# Patient Record
Sex: Male | Born: 1955 | Race: White | Hispanic: No | Marital: Married | Smoking: Former smoker
Health system: Southern US, Community
[De-identification: ages and names within clinical notes are randomized; demographics above are authoritative.]

## PROBLEM LIST (undated history)

## (undated) DIAGNOSIS — L729 Follicular cyst of the skin and subcutaneous tissue, unspecified: Secondary | ICD-10-CM

## (undated) DIAGNOSIS — Z789 Other specified health status: Secondary | ICD-10-CM

## (undated) HISTORY — PX: ROTATOR CUFF REPAIR: SHX139

## (undated) HISTORY — PX: OTHER SURGICAL HISTORY: SHX169

## (undated) HISTORY — PX: JOINT REPLACEMENT: SHX530

## (undated) HISTORY — PX: MECKEL DIVERTICULUM EXCISION: SHX314

---

## 2008-12-27 DEATH — deceased

## 2014-11-21 ENCOUNTER — Other Ambulatory Visit: Payer: Self-pay | Admitting: Neurosurgery

## 2014-11-22 ENCOUNTER — Other Ambulatory Visit: Payer: Self-pay | Admitting: Neurosurgery

## 2014-12-07 ENCOUNTER — Ambulatory Visit: Payer: Self-pay | Admitting: Neurology

## 2014-12-14 ENCOUNTER — Encounter (HOSPITAL_COMMUNITY)
Admission: RE | Admit: 2014-12-14 | Discharge: 2014-12-14 | Disposition: A | Discharge status: Home / Self Care | Payer: BLUE CROSS/BLUE SHIELD | Source: Ambulatory Visit | Attending: Neurosurgery | Admitting: Neurosurgery

## 2014-12-14 ENCOUNTER — Encounter (HOSPITAL_COMMUNITY): Payer: Self-pay

## 2014-12-14 DIAGNOSIS — Z01812 Encounter for preprocedural laboratory examination: Secondary | ICD-10-CM | POA: Diagnosis present

## 2014-12-14 DIAGNOSIS — M5126 Other intervertebral disc displacement, lumbar region: Secondary | ICD-10-CM | POA: Insufficient documentation

## 2014-12-14 HISTORY — DX: Other specified health status: Z78.9

## 2014-12-14 HISTORY — DX: Follicular cyst of the skin and subcutaneous tissue, unspecified: L72.9

## 2014-12-14 LAB — SURGICAL PCR SCREEN
MRSA, PCR: NEGATIVE
Staphylococcus aureus: NEGATIVE

## 2014-12-14 LAB — BASIC METABOLIC PANEL
Anion gap: 10 (ref 5–15)
BUN: 5 mg/dL — ABNORMAL LOW (ref 6–20)
CO2: 26 mmol/L (ref 22–32)
Calcium: 10 mg/dL (ref 8.9–10.3)
Chloride: 100 mmol/L — ABNORMAL LOW (ref 101–111)
Creatinine, Ser: 0.65 mg/dL (ref 0.61–1.24)
GFR calc Af Amer: >60 mL/min (ref >60)
GFR calc non Af Amer: >60 mL/min (ref >60)
Glucose, Bld: 93 mg/dL (ref 65–99)
Potassium: 3.8 mmol/L (ref 3.5–5.1)
Sodium: 136 mmol/L (ref 135–145)

## 2014-12-14 LAB — CBC
HCT: 46.3 % (ref 39.0–52.0)
Hemoglobin: 16.2 g/dL (ref 13.0–17.0)
MCH: 34.2 pg — ABNORMAL HIGH (ref 26.0–34.0)
MCHC: 35 g/dL (ref 30.0–36.0)
MCV: 97.7 fL (ref 78.0–100.0)
Platelets: 253 10*3/uL (ref 150–400)
RBC: 4.74 MIL/uL (ref 4.22–5.81)
RDW: 12.7 % (ref 11.5–15.5)
WBC: 11 10*3/uL — ABNORMAL HIGH (ref 4.0–10.5)

## 2014-12-14 NOTE — Pre-Procedure Instructions (Signed)
    Douglas Henry  12/14/2014      [id number] - [place],  - [address] [facility] [address] AT [facility] [address] [place] [address] Phone: [phone] Fax: [phone]  [facility] [phone] - [place], [place] - [address] [place] [address] Phone: [phone] Fax: [phone]    Your procedure is scheduled on 12/24/14.  Report to [facility] Admitting at 530 A.M.  Call this number if you have problems the morning of surgery:  [phone]   Remember:  Do not eat food or drink liquids after midnight.  Take these medicines the morning of surgery with A SIP OF WATER none   Do not wear jewelry, make-up or nail polish.  Do not wear lotions, powders, or perfumes.  You may wear deodorant.  Do not shave 48 hours prior to surgery.  Men may shave face and neck.  Do not bring valuables to the hospital.  [facility] is not responsible for any belongings or valuables.  Contacts, dentures or bridgework may not be worn into surgery.  Leave your suitcase in the car.  After surgery it may be brought to your room.  For patients admitted to the hospital, discharge time will be determined by your treatment team.  Patients discharged the day of surgery will not be allowed to drive home.   Name and phone number of your driver:    Special instructions:    Please read over the following fact sheets that you were given. Pain Booklet, Coughing and Deep Breathing and MRSA Information

## 2014-12-24 ENCOUNTER — Ambulatory Visit (HOSPITAL_COMMUNITY)
Admission: RE | Admit: 2014-12-24 | Discharge: 2014-12-24 | Disposition: A | Discharge status: Home / Self Care | Payer: BLUE CROSS/BLUE SHIELD | Source: Ambulatory Visit | Attending: Neurosurgery | Admitting: Neurosurgery

## 2014-12-24 ENCOUNTER — Ambulatory Visit (HOSPITAL_COMMUNITY): Payer: BLUE CROSS/BLUE SHIELD | Admitting: Anesthesiology

## 2014-12-24 ENCOUNTER — Encounter (HOSPITAL_COMMUNITY)
Admission: RE | Disposition: A | Discharge status: Home / Self Care | Payer: Self-pay | Source: Ambulatory Visit | Attending: Neurosurgery

## 2014-12-24 ENCOUNTER — Ambulatory Visit (HOSPITAL_COMMUNITY): Payer: BLUE CROSS/BLUE SHIELD

## 2014-12-24 DIAGNOSIS — Z885 Allergy status to narcotic agent status: Secondary | ICD-10-CM | POA: Diagnosis not present

## 2014-12-24 DIAGNOSIS — M4806 Spinal stenosis, lumbar region: Secondary | ICD-10-CM | POA: Insufficient documentation

## 2014-12-24 DIAGNOSIS — M5116 Intervertebral disc disorders with radiculopathy, lumbar region: Secondary | ICD-10-CM | POA: Insufficient documentation

## 2014-12-24 DIAGNOSIS — Z888 Allergy status to other drugs, medicaments and biological substances status: Secondary | ICD-10-CM | POA: Diagnosis not present

## 2014-12-24 DIAGNOSIS — F17210 Nicotine dependence, cigarettes, uncomplicated: Secondary | ICD-10-CM | POA: Diagnosis not present

## 2014-12-24 DIAGNOSIS — M5126 Other intervertebral disc displacement, lumbar region: Secondary | ICD-10-CM | POA: Diagnosis present

## 2014-12-24 HISTORY — PX: LUMBAR LAMINECTOMY/DECOMPRESSION MICRODISCECTOMY: SHX5026

## 2014-12-24 SURGERY — LUMBAR LAMINECTOMY/DECOMPRESSION MICRODISCECTOMY 1 LEVEL
Anesthesia: General | Site: Spine Lumbar | Laterality: Left

## 2014-12-24 MED ORDER — ZOLPIDEM TARTRATE 5 MG PO TABS: 5.0000 mg | ORAL_TABLET | Freq: Every evening | ORAL | Status: DC | PRN

## 2014-12-24 MED ORDER — THROMBIN 5000 UNITS EX SOLR
CUTANEOUS | Status: DC | PRN
  Administered 2014-12-24 (×2): 5000 [IU] via TOPICAL

## 2014-12-24 MED ORDER — HYDROCODONE-ACETAMINOPHEN 5-325 MG PO TABS
1.0000 | ORAL_TABLET | ORAL | Status: DC | PRN
  Administered 2014-12-24: 2 via ORAL
  Filled 2014-12-24: qty 2

## 2014-12-24 MED ORDER — PROPOFOL 10 MG/ML IV BOLUS
INTRAVENOUS | Status: DC | PRN
  Administered 2014-12-24: 200 mg via INTRAVENOUS

## 2014-12-24 MED ORDER — HYDROXYZINE HCL 25 MG PO TABS: 50.0000 mg | ORAL_TABLET | ORAL | Status: DC | PRN

## 2014-12-24 MED ORDER — ACETAMINOPHEN 160 MG/5ML PO SOLN
325.0000 mg | ORAL | Status: DC | PRN
  Filled 2014-12-24: qty 20.3

## 2014-12-24 MED ORDER — ACETAMINOPHEN 325 MG PO TABS: 325.0000 mg | ORAL_TABLET | ORAL | Status: DC | PRN

## 2014-12-24 MED ORDER — BISACODYL 10 MG RE SUPP: 10.0000 mg | Freq: Every day | RECTAL | Status: DC | PRN

## 2014-12-24 MED ORDER — HYDROMORPHONE HCL 1 MG/ML IJ SOLN
INTRAMUSCULAR | Status: AC
  Filled 2014-12-24: qty 1

## 2014-12-24 MED ORDER — NEOSTIGMINE METHYLSULFATE 10 MG/10ML IV SOLN
INTRAVENOUS | Status: DC | PRN
  Administered 2014-12-24: 3 mg via INTRAVENOUS

## 2014-12-24 MED ORDER — ROCURONIUM BROMIDE 100 MG/10ML IV SOLN
INTRAVENOUS | Status: DC | PRN
  Administered 2014-12-24: 50 mg via INTRAVENOUS

## 2014-12-24 MED ORDER — HYDROMORPHONE HCL 1 MG/ML IJ SOLN
0.2500 mg | INTRAMUSCULAR | Status: DC | PRN
  Administered 2014-12-24: 0.5 mg via INTRAVENOUS

## 2014-12-24 MED ORDER — GLYCOPYRROLATE 0.2 MG/ML IJ SOLN
INTRAMUSCULAR | Status: DC | PRN
  Administered 2014-12-24: 0.4 mg via INTRAVENOUS

## 2014-12-24 MED ORDER — ONDANSETRON HCL 40 MG/20ML IJ SOLN: 8.0000 mg | Freq: Four times a day (QID) | INTRAMUSCULAR | Status: DC | PRN

## 2014-12-24 MED ORDER — SODIUM CHLORIDE 0.9 % IJ SOLN
INTRAMUSCULAR | Status: AC
  Filled 2014-12-24: qty 10

## 2014-12-24 MED ORDER — DEXAMETHASONE SODIUM PHOSPHATE 4 MG/ML IJ SOLN
INTRAMUSCULAR | Status: AC
  Filled 2014-12-24: qty 1

## 2014-12-24 MED ORDER — FENTANYL CITRATE (PF) 250 MCG/5ML IJ SOLN
INTRAMUSCULAR | Status: AC
  Filled 2014-12-24: qty 5

## 2014-12-24 MED ORDER — LIDOCAINE-EPINEPHRINE 1 %-1:100000 IJ SOLN
INTRAMUSCULAR | Status: DC | PRN
  Administered 2014-12-24: 10 mL

## 2014-12-24 MED ORDER — ALUM & MAG HYDROXIDE-SIMETH 200-200-20 MG/5ML PO SUSP: 30.0000 mL | Freq: Four times a day (QID) | ORAL | Status: DC | PRN

## 2014-12-24 MED ORDER — OXYCODONE HCL 5 MG/5ML PO SOLN: 5.0000 mg | Freq: Once | ORAL | Status: DC | PRN

## 2014-12-24 MED ORDER — DEXAMETHASONE SODIUM PHOSPHATE 4 MG/ML IJ SOLN
INTRAMUSCULAR | Status: DC | PRN
  Administered 2014-12-24: 4 mg via INTRAVENOUS

## 2014-12-24 MED ORDER — MIDAZOLAM HCL 5 MG/5ML IJ SOLN
INTRAMUSCULAR | Status: DC | PRN
  Administered 2014-12-24: 2 mg via INTRAVENOUS

## 2014-12-24 MED ORDER — MAGNESIUM HYDROXIDE 400 MG/5ML PO SUSP: 30.0000 mL | Freq: Every day | ORAL | Status: DC | PRN

## 2014-12-24 MED ORDER — HYDROXYZINE HCL 50 MG/ML IM SOLN
50.0000 mg | INTRAMUSCULAR | Status: DC | PRN
  Filled 2014-12-24: qty 1

## 2014-12-24 MED ORDER — SODIUM CHLORIDE 0.9 % IR SOLN
Status: DC | PRN
  Administered 2014-12-24: 08:00:00

## 2014-12-24 MED ORDER — HYDROCODONE-ACETAMINOPHEN 5-325 MG PO TABS: 1.0000 | ORAL_TABLET | ORAL | Status: DC | PRN

## 2014-12-24 MED ORDER — OXYCODONE HCL 5 MG PO TABS: 5.0000 mg | ORAL_TABLET | Freq: Once | ORAL | Status: DC | PRN

## 2014-12-24 MED ORDER — FENTANYL CITRATE (PF) 100 MCG/2ML IJ SOLN
INTRAMUSCULAR | Status: DC | PRN
  Administered 2014-12-24: 100 ug via INTRAVENOUS

## 2014-12-24 MED ORDER — ACETAMINOPHEN 325 MG PO TABS
650.0000 mg | ORAL_TABLET | ORAL | Status: DC | PRN
Start: 2014-12-24 — End: 2014-12-24

## 2014-12-24 MED ORDER — ACETAMINOPHEN 10 MG/ML IV SOLN
INTRAVENOUS | Status: AC
  Administered 2014-12-24: 1000 mg via INTRAVENOUS
  Filled 2014-12-24: qty 100

## 2014-12-24 MED ORDER — MORPHINE SULFATE 4 MG/ML IJ SOLN: 4.0000 mg | INTRAMUSCULAR | Status: DC | PRN

## 2014-12-24 MED ORDER — SODIUM CHLORIDE 0.9 % IJ SOLN: 3.0000 mL | Freq: Two times a day (BID) | INTRAMUSCULAR | Status: DC

## 2014-12-24 MED ORDER — MENTHOL 3 MG MT LOZG: 1.0000 | LOZENGE | OROMUCOSAL | Status: DC | PRN

## 2014-12-24 MED ORDER — PHENOL 1.4 % MT LIQD: 1.0000 | OROMUCOSAL | Status: DC | PRN

## 2014-12-24 MED ORDER — ONDANSETRON HCL 4 MG/2ML IJ SOLN: 4.0000 mg | Freq: Four times a day (QID) | INTRAMUSCULAR | Status: DC | PRN

## 2014-12-24 MED ORDER — SUCCINYLCHOLINE CHLORIDE 20 MG/ML IJ SOLN
INTRAMUSCULAR | Status: AC
  Filled 2014-12-24: qty 1

## 2014-12-24 MED ORDER — SODIUM CHLORIDE 0.9 % IV SOLN: 250.0000 mL | INTRAVENOUS | Status: DC

## 2014-12-24 MED ORDER — EPHEDRINE SULFATE 50 MG/ML IJ SOLN
INTRAMUSCULAR | Status: AC
  Filled 2014-12-24: qty 1

## 2014-12-24 MED ORDER — MIDAZOLAM HCL 2 MG/2ML IJ SOLN
INTRAMUSCULAR | Status: AC
  Filled 2014-12-24: qty 2

## 2014-12-24 MED ORDER — CEFAZOLIN SODIUM-DEXTROSE 2-3 GM-% IV SOLR
2.0000 g | INTRAVENOUS | Status: AC
  Administered 2014-12-24: 2 g via INTRAVENOUS

## 2014-12-24 MED ORDER — FENTANYL CITRATE (PF) 100 MCG/2ML IJ SOLN
INTRAMUSCULAR | Status: DC | PRN
  Administered 2014-12-24: 50 ug via INTRAVENOUS
  Administered 2014-12-24: 100 ug via INTRAVENOUS
  Administered 2014-12-24 (×2): 50 ug via INTRAVENOUS

## 2014-12-24 MED ORDER — PROPOFOL 10 MG/ML IV BOLUS
INTRAVENOUS | Status: AC
  Filled 2014-12-24: qty 20

## 2014-12-24 MED ORDER — THROMBIN 5000 UNITS EX SOLR
OROMUCOSAL | Status: DC | PRN
  Administered 2014-12-24: 09:00:00 via TOPICAL

## 2014-12-24 MED ORDER — OXYCODONE-ACETAMINOPHEN 5-325 MG PO TABS: 1.0000 | ORAL_TABLET | ORAL | Status: DC | PRN

## 2014-12-24 MED ORDER — SODIUM CHLORIDE 0.9 % IV SOLN
10.0000 mg | INTRAVENOUS | Status: DC | PRN
  Administered 2014-12-24: 10 ug/min via INTRAVENOUS

## 2014-12-24 MED ORDER — CEFAZOLIN SODIUM-DEXTROSE 2-3 GM-% IV SOLR: 2.0000 g | INTRAVENOUS | Status: DC

## 2014-12-24 MED ORDER — ONDANSETRON HCL 4 MG/2ML IJ SOLN
INTRAMUSCULAR | Status: AC
  Filled 2014-12-24: qty 2

## 2014-12-24 MED ORDER — ONDANSETRON HCL 4 MG/2ML IJ SOLN
INTRAMUSCULAR | Status: DC | PRN
  Administered 2014-12-24 (×2): 4 mg via INTRAVENOUS

## 2014-12-24 MED ORDER — KCL IN DEXTROSE-NACL 20-5-0.45 MEQ/L-%-% IV SOLN
INTRAVENOUS | Status: DC
  Filled 2014-12-24 (×3): qty 1000

## 2014-12-24 MED ORDER — FENTANYL CITRATE (PF) 100 MCG/2ML IJ SOLN
INTRAMUSCULAR | Status: AC
  Filled 2014-12-24: qty 2

## 2014-12-24 MED ORDER — EPHEDRINE SULFATE 50 MG/ML IJ SOLN
INTRAMUSCULAR | Status: DC | PRN
  Administered 2014-12-24: 10 mg via INTRAVENOUS

## 2014-12-24 MED ORDER — METHYLPREDNISOLONE ACETATE 80 MG/ML IJ SUSP
INTRAMUSCULAR | Status: DC | PRN
  Administered 2014-12-24: 80 mg

## 2014-12-24 MED ORDER — ACETAMINOPHEN 650 MG RE SUPP: 650.0000 mg | RECTAL | Status: DC | PRN

## 2014-12-24 MED ORDER — CYCLOBENZAPRINE HCL 10 MG PO TABS
10.0000 mg | ORAL_TABLET | Freq: Three times a day (TID) | ORAL | Status: DC | PRN
  Filled 2014-12-24: qty 1

## 2014-12-24 MED ORDER — LIDOCAINE HCL (CARDIAC) 20 MG/ML IV SOLN
INTRAVENOUS | Status: DC | PRN
  Administered 2014-12-24: 100 mg via INTRAVENOUS

## 2014-12-24 MED ORDER — ARTIFICIAL TEARS OP OINT
TOPICAL_OINTMENT | OPHTHALMIC | Status: AC
  Filled 2014-12-24: qty 3.5

## 2014-12-24 MED ORDER — ONDANSETRON HCL 4 MG PO TABS: 4.0000 mg | ORAL_TABLET | Freq: Four times a day (QID) | ORAL | Status: DC | PRN

## 2014-12-24 MED ORDER — ROCURONIUM BROMIDE 50 MG/5ML IV SOLN
INTRAVENOUS | Status: AC
  Filled 2014-12-24: qty 1

## 2014-12-24 MED ORDER — KETOROLAC TROMETHAMINE 30 MG/ML IJ SOLN
30.0000 mg | Freq: Four times a day (QID) | INTRAMUSCULAR | Status: DC
  Administered 2014-12-24 (×2): 30 mg via INTRAVENOUS
  Filled 2014-12-24 (×2): qty 1

## 2014-12-24 MED ORDER — 0.9 % SODIUM CHLORIDE (POUR BTL) OPTIME
TOPICAL | Status: DC | PRN
  Administered 2014-12-24: 1000 mL

## 2014-12-24 MED ORDER — LACTATED RINGERS IV SOLN
INTRAVENOUS | Status: DC | PRN
  Administered 2014-12-24: 07:00:00 via INTRAVENOUS

## 2014-12-24 MED ORDER — LIDOCAINE HCL (CARDIAC) 20 MG/ML IV SOLN
INTRAVENOUS | Status: AC
  Filled 2014-12-24: qty 5

## 2014-12-24 MED ORDER — BUPIVACAINE HCL (PF) 0.5 % IJ SOLN
INTRAMUSCULAR | Status: DC | PRN
  Administered 2014-12-24: 10 mL

## 2014-12-24 MED ORDER — KETOROLAC TROMETHAMINE 30 MG/ML IJ SOLN
INTRAMUSCULAR | Status: AC
  Filled 2014-12-24: qty 1

## 2014-12-24 MED ORDER — SODIUM CHLORIDE 0.9 % IJ SOLN: 3.0000 mL | INTRAMUSCULAR | Status: DC | PRN

## 2014-12-24 MED ORDER — KETOROLAC TROMETHAMINE 30 MG/ML IJ SOLN
30.0000 mg | Freq: Once | INTRAMUSCULAR | Status: AC
  Administered 2014-12-24: 30 mg via INTRAVENOUS

## 2014-12-24 SURGICAL SUPPLY — 64 items
ADH SKN CLS APL DERMABOND .7 (GAUZE/BANDAGES/DRESSINGS) ×2
APL SKNCLS STERI-STRIP NONHPOA (GAUZE/BANDAGES/DRESSINGS)
BAG DECANTER FOR FLEXI CONT (MISCELLANEOUS) ×2 IMPLANT
BENZOIN TINCTURE PRP APPL 2/3 (GAUZE/BANDAGES/DRESSINGS) IMPLANT
BLADE CLIPPER SURG (BLADE) IMPLANT
BRUSH SCRUB EZ PLAIN DRY (MISCELLANEOUS) ×2 IMPLANT
BUR ACORN 6.0 ACORN (BURR) IMPLANT
BUR ACRON 5.0MM COATED (BURR) ×1 IMPLANT
BUR MATCHSTICK NEURO 3.0 LAGG (BURR) ×2 IMPLANT
CANISTER SUCT 3000ML PPV (MISCELLANEOUS) ×2 IMPLANT
CONT SPEC 4OZ CLIKSEAL STRL BL (MISCELLANEOUS) IMPLANT
DERMABOND ADVANCED (GAUZE/BANDAGES/DRESSINGS) ×2
DERMABOND ADVANCED .7 DNX12 (GAUZE/BANDAGES/DRESSINGS) IMPLANT
DRAPE LAPAROTOMY 100X72X124 (DRAPES) ×2 IMPLANT
DRAPE MICROSCOPE LEICA (MISCELLANEOUS) ×3 IMPLANT
DRAPE POUCH INSTRU U-SHP 10X18 (DRAPES) ×2 IMPLANT
DRSG EMULSION OIL 3X3 NADH (GAUZE/BANDAGES/DRESSINGS) IMPLANT
ELECT REM PT RETURN 9FT ADLT (ELECTROSURGICAL) ×2
ELECTRODE REM PT RTRN 9FT ADLT (ELECTROSURGICAL) ×1 IMPLANT
GAUZE SPONGE 4X4 12PLY STRL (GAUZE/BANDAGES/DRESSINGS) IMPLANT
GAUZE SPONGE 4X4 16PLY XRAY LF (GAUZE/BANDAGES/DRESSINGS) IMPLANT
GLOVE BIO SURGEON STRL SZ7 (GLOVE) ×2 IMPLANT
GLOVE BIOGEL PI IND STRL 8 (GLOVE) ×1 IMPLANT
GLOVE BIOGEL PI IND STRL 8.5 (GLOVE) IMPLANT
GLOVE BIOGEL PI INDICATOR 8 (GLOVE) ×2
GLOVE BIOGEL PI INDICATOR 8.5 (GLOVE) ×1
GLOVE ECLIPSE 7.5 STRL STRAW (GLOVE) ×3 IMPLANT
GLOVE ECLIPSE 8.0 STRL XLNG CF (GLOVE) ×1 IMPLANT
GLOVE EXAM NITRILE LRG STRL (GLOVE) IMPLANT
GLOVE EXAM NITRILE MD LF STRL (GLOVE) IMPLANT
GLOVE EXAM NITRILE XL STR (GLOVE) IMPLANT
GLOVE EXAM NITRILE XS STR PU (GLOVE) IMPLANT
GOWN STRL REUS W/ TWL LRG LVL3 (GOWN DISPOSABLE) ×1 IMPLANT
GOWN STRL REUS W/ TWL XL LVL3 (GOWN DISPOSABLE) IMPLANT
GOWN STRL REUS W/TWL 2XL LVL3 (GOWN DISPOSABLE) ×1 IMPLANT
GOWN STRL REUS W/TWL LRG LVL3 (GOWN DISPOSABLE) ×2
GOWN STRL REUS W/TWL XL LVL3 (GOWN DISPOSABLE) ×2
HEMOSTAT POWDER KIT SURGIFOAM (HEMOSTASIS) ×1 IMPLANT
KIT BASIN OR (CUSTOM PROCEDURE TRAY) ×2 IMPLANT
KIT ROOM TURNOVER OR (KITS) ×2 IMPLANT
NDL HYPO 18GX1.5 BLUNT FILL (NEEDLE) IMPLANT
NDL SPNL 18GX3.5 QUINCKE PK (NEEDLE) ×1 IMPLANT
NDL SPNL 22GX3.5 QUINCKE BK (NEEDLE) ×1 IMPLANT
NEEDLE HYPO 18GX1.5 BLUNT FILL (NEEDLE) ×2 IMPLANT
NEEDLE SPNL 18GX3.5 QUINCKE PK (NEEDLE) ×2 IMPLANT
NEEDLE SPNL 22GX3.5 QUINCKE BK (NEEDLE) ×2 IMPLANT
NS IRRIG 1000ML POUR BTL (IV SOLUTION) ×2 IMPLANT
PACK LAMINECTOMY NEURO (CUSTOM PROCEDURE TRAY) ×2 IMPLANT
PAD ARMBOARD 7.5X6 YLW CONV (MISCELLANEOUS) ×6 IMPLANT
PATTIES SURGICAL .5 X1 (DISPOSABLE) ×1 IMPLANT
RUBBERBAND STERILE (MISCELLANEOUS) ×6 IMPLANT
SPONGE LAP 4X18 X RAY DECT (DISPOSABLE) IMPLANT
SPONGE SURGIFOAM ABS GEL SZ50 (HEMOSTASIS) ×2 IMPLANT
STRIP CLOSURE SKIN 1/2X4 (GAUZE/BANDAGES/DRESSINGS) IMPLANT
SUT PROLENE 6 0 BV (SUTURE) IMPLANT
SUT VIC AB 1 CT1 18XBRD ANBCTR (SUTURE) ×1 IMPLANT
SUT VIC AB 1 CT1 8-18 (SUTURE) ×4
SUT VIC AB 2-0 CP2 18 (SUTURE) ×3 IMPLANT
SUT VIC AB 3-0 SH 8-18 (SUTURE) ×1 IMPLANT
SYR 20ML ECCENTRIC (SYRINGE) ×2 IMPLANT
SYR 5ML LL (SYRINGE) ×1 IMPLANT
TOWEL OR 17X24 6PK STRL BLUE (TOWEL DISPOSABLE) ×2 IMPLANT
TOWEL OR 17X26 10 PK STRL BLUE (TOWEL DISPOSABLE) ×2 IMPLANT
WATER STERILE IRR 1000ML POUR (IV SOLUTION) ×2 IMPLANT

## 2014-12-24 NOTE — Op Note (Signed)
12/24/2014  10:15 AM  PATIENT:  Douglas Henry  59 y.o. male  PRE-OPERATIVE DIAGNOSIS:  Left L4-5 lateral recess, foraminal, and extraforaminal lumbar herniated disc; left L4-5 lateral recess and neural foraminal stenosis, lumbar spondylosis, lumbar degenerative disease, left lumbar radiculopathy  POST-OPERATIVE DIAGNOSIS: Left L4-5 lateral recess, foraminal, and extraforaminal lumbar herniated disc; left L4-5 lateral recess and neural foraminal stenosis, lumbar spondylosis, lumbar degenerative disease, left lumbar radiculopathy  PROCEDURE:  Procedure(s):  Left L4-5 lumbar laminotomy, left L4-5 extraforaminal exploration, left L4-5 microdiscectomy, with microdissection, microsurgical technique, and the operating microscope  SURGEON:  Surgeon(s): Shirlean Kelly, MD Barnett Abu, MD  ASSISTANTS: Barnett Abu, M.D.  ANESTHESIA:   general  EBL:  Total I/O In: 900 [I.V.:900] Out: 100 [Blood:100]  BLOOD ADMINISTERED:none  COUNT: Correct per nursing staff  DICTATION: Patient was brought to the operating room and placed under general endotracheal anesthesia. Patient was turned to prone position the lumbar region was prepped with Betadine soap and solution and draped in a sterile fashion. The midline was infiltrated with local anesthetic with epinephrine.  Midline incision was made and was carried down through the subcutaneous tissue to the lumbar fascia. The lumbar fascia was incised on the left side and the paraspinal muscles were dissected from the spinous processes and lamina in a subperiosteal fashion. An x-ray was taken and the L4-5 intralaminar space was identified. The operating microscope was draped and brought into the field provided additional magnification, illumination, and visualization. The left L4-5 extraforaminal space was exposed. There was significant hypertrophic overgrowth of the left L4-5 facet complex. The extraforaminal space was explored, and the left L4 nerve root was  identified. We further identified the left L4-5 extra foraminal disc annulus, which was protruding. Laminotomy was performed on the left side at L4-5 using the high-speed drill and Kerrison punches. The ligamentum flavum was carefully resected. There was a small-moderate-sized synovial cyst arising from the arthropathic facet joint, that was carefully removed.The underlying thecal sac and exiting L4 and L5 nerve roots were identified. The disc herniation was identified and the thecal sac and nerve root gently retracted medially. Discectomy was begun by incising the annulus in the left L4-5 lateral recess. The disc space was entered, and a thorough discectomy performed using a variety of pituitary rongeurs and micro-curettes. As a discectomy was performed, with particular attention to the lateral portion of the disc, we able to decompress the subligamentous disc herniation laterally within the neural foramen and extraforaminal space. We able to decompress the left L4-5 lateral recess, left L4-5 neural foramen, and left L4-5 extraforaminal space, thereby decompressing the thecal sac, and the exiting L4 and L5 nerve roots. Once the discectomy was completed and good decompression of the thecal sac and L4 and L5 nerve roots had been achieved hemostasis was established with the use of bone wax, bipolar cautery, Gelfoam with thrombin, and Surgifoam. Hemostasis was confirmed. We then instilled 2 cc of fentanyl and 80 mg of Depo-Medrol into the extraforaminal and epidural spaces. Deep fascia was closed with interrupted undyed 1 Vicryl sutures. Scarpa's fascia was closed with interrupted undyed 1 Vicryl sutures in the subcutaneous and subcuticular layer were closed with interrupted inverted 2-0 undyed Vicryl sutures. The skin edges were approximated with Dermabond. Following surgery the patient was turned back to a supine position to be reversed from the anesthetic extubated and transferred to the recovery room for further  care.   PLAN OF CARE: Admit for overnight observation  PATIENT DISPOSITION:  PACU - hemodynamically stable.  Delay start of Pharmacological VTE agent (>24hrs) due to surgical blood loss or risk of bleeding:  yes

## 2014-12-24 NOTE — Anesthesia Preprocedure Evaluation (Addendum)
Anesthesia Evaluation  Patient identified by MRN, date of birth, ID band Patient awake    Reviewed: Allergy & Precautions, NPO status , Patient's Chart, lab work & pertinent test results  History of Anesthesia Complications Negative for: history of anesthetic complications  Airway Mallampati: II  TM Distance: >3 FB Neck ROM: Full    Dental  (+) Teeth Intact   Pulmonary neg shortness of breath, neg sleep apnea, neg COPDneg recent URI, Current Smoker,  breath sounds clear to auscultation        Cardiovascular negative cardio ROS  Rhythm:Regular     Neuro/Psych Low back pain  Neuromuscular disease negative psych ROS   GI/Hepatic negative GI ROS, Neg liver ROS,   Endo/Other  negative endocrine ROS  Renal/GU negative Renal ROS     Musculoskeletal negative musculoskeletal ROS (+)   Abdominal   Peds  Hematology negative hematology ROS (+)   Anesthesia Other Findings   Reproductive/Obstetrics                           Anesthesia Physical Anesthesia Plan  ASA: II  Anesthesia Plan: General   Post-op Pain Management:    Induction: Intravenous  Airway Management Planned: Oral ETT  Additional Equipment: None  Intra-op Plan:   Post-operative Plan: Extubation in OR  Informed Consent: I have reviewed the patients History and Physical, chart, labs and discussed the procedure including the risks, benefits and alternatives for the proposed anesthesia with the patient or authorized representative who has indicated his/her understanding and acceptance.   Dental advisory given  Plan Discussed with: CRNA and Surgeon  Anesthesia Plan Comments:         Anesthesia Quick Evaluation

## 2014-12-24 NOTE — H&P (Signed)
Subjective: Patient is a 59 y.o. right-handed white male who is admitted for treatment of left lumbar radiculopathy secondary to an L4-5 lumbar disc herniation superimposed on underlying lumbar spondylosis and degenerative disc disease, with resulting left L4-5 lateral recess stenosis as well as neural compression within the left L4-5 foramen and extra foraminal space.  Patient has significant weakness and numbness in the distal left lower extremity on examination and is admitted now for left L4-5 lumbar laminotomy, extra foraminal exploration, and microdiscectomy.    Past Medical History  Diagnosis Date  . Skin cysts, generalized   . Medical history non-contributory     Past Surgical History  Procedure Laterality Date  . Joint replacement      scope  rt knee  . Rotator cuff repair    . Meckel diverticulum excision    . Sagill   spilt      No prescriptions prior to admission   Allergies  Allergen Reactions  . Darvon [Propoxyphene] Nausea Only  . Lexapro [Escitalopram Oxalate] Nausea Only    History  Substance Use Topics  . Smoking status: Current Every Day Smoker -- 1.00 packs/day for 38 years  . Smokeless tobacco: Not on file  . Alcohol Use: Yes     Comment: daily    No family history on file.   Review of Systems A comprehensive review of systems was negative.  Objective: Vital signs in last 24 hours: Pulse Rate:  [88] 88 (06/27 0554) Resp:  [20] 20 (06/27 0554) BP: (117)/(84) 117/84 mmHg (06/27 0554) SpO2:  [99 %] 99 % (06/27 0554)  EXAM: Patient is a well-developed well-nourished white male in no acute distress. Lungs are clear to auscultation , the patient has symmetrical respiratory excursion. Heart has a regular rate and rhythm normal S1 and S2 no murmur.   Abdomen is soft nontender nondistended bowel sounds are present. Extremity examination shows no clubbing cyanosis or edema. Neurologic examination shows 5/5 strength through the right lower extremity including the  iliopsoas, quadriceps, dorsiflexor, EHL, and plantar flexor. However in the left lower extremity iliopsoas is 4, the quadriceps is 5, the dorsiflexor is 4-4+, the EHL is 3, the everter is 3, the inverter is 4-4+, and the plantar flexors 5. Sensation is decreased to pinprick in the medial aspect of the left leg as well as to the dorsum the left foot, more so medially than laterally. Reflexes show the left quadriceps is 1 right quadriceps is 2. The gastric name is absent bilaterally. Toes are down going bilaterally. Gait and stance both favor the left lower extremity.  Data Review:CBC    Component Value Date/Time   WBC 11.0* 12/14/2014 1033   RBC 4.74 12/14/2014 1033   HGB 16.2 12/14/2014 1033   HCT 46.3 12/14/2014 1033   PLT 253 12/14/2014 1033   MCV 97.7 12/14/2014 1033   MCH 34.2* 12/14/2014 1033   MCHC 35.0 12/14/2014 1033   RDW 12.7 12/14/2014 1033                          BMET    Component Value Date/Time   NA 136 12/14/2014 1033   K 3.8 12/14/2014 1033   CL 100* 12/14/2014 1033   CO2 26 12/14/2014 1033   GLUCOSE 93 12/14/2014 1033   BUN 5* 12/14/2014 1033   CREATININE 0.65 12/14/2014 1033   CALCIUM 10.0 12/14/2014 1033   GFRNONAA >60 12/14/2014 1033   GFRAA >60 12/14/2014 1033  Assessment/Plan: Patient with left lumbar radiculopathy, with weakness and numbness, secondary to left L4-5 lumbar disc patient left L4-5 lateral recess stenosis, neural compression within the left L4-5 foramen and extraforaminal space.. Patient is admitted for a left L4-5 lumbar laminotomy, extra foraminal exploration, and microdiscectomy.  I've discussed with the patient the nature of his condition, the nature the surgical procedure, the typical length of surgery, hospital stay, and overall recuperation. We discussed limitations postoperatively. I discussed risks of surgery including risks of infection, bleeding, possibly need for transfusion, the risk of nerve root dysfunction with pain, weakness,  numbness, or paresthesias, or risk of dural tear and CSF leakage and possible need for further surgery, the risk of recurrent disc herniation and the possible need for further surgery, and the risk of anesthetic complications including myocardial infarction, stroke, pneumonia, and death. Understanding all this the patient does wish to proceed with surgery and is admitted for such.   Hewitt Henry,Douglas W, MD 12/24/2014 7:21 AM

## 2014-12-24 NOTE — Transfer of Care (Signed)
Immediate Anesthesia Transfer of Care Note  Patient: Douglas Henry  Procedure(s) Performed: Procedure(s) with comments: LEFT LUMBAR FOUR-FIVE LUMBAR LAMINECTOMY/DECOMPRESSION MICRODISCECTOMY  (Left) - Left L45 laminotomy, extraforaminal exploration and microdiskectomy  Patient Location: PACU  Anesthesia Type:General  Level of Consciousness: awake, alert  and oriented  Airway & Oxygen Therapy: Patient Spontanous Breathing and Patient connected to nasal cannula oxygen  Post-op Assessment: Report given to RN, Post -op Vital signs reviewed and stable and Patient moving all extremities X 4  Post vital signs: Reviewed and stable  Last Vitals:  Filed Vitals:   12/24/14 0554  BP: 117/84  Pulse: 88  Resp: 20    Complications: No apparent anesthesia complications

## 2014-12-24 NOTE — Progress Notes (Signed)
Discharge instructions, education and Rx given to patient with wife at bedside and they both verbalized understanding. No drainage, no redness and no swelling noted on incision site. Pain is minimal pert patient.

## 2014-12-24 NOTE — Discharge Summary (Signed)
Physician Discharge Summary  Patient ID: Douglas LukesRichard H Sharpless MRN: [id number] DOB/AGE: 59/01/1956 59 y.o.  Admit date: 12/24/2014 Discharge date: 12/24/2014  Admission Diagnoses:  Left L4-5 lateral recess, foraminal, and extraforaminal lumbar herniated disc; left L4-5 lateral recess and neural foraminal stenosis, lumbar spondylosis, lumbar degenerative disease, left lumbar radiculopathy  Discharge Diagnoses:  Left L4-5 lateral recess, foraminal, and extraforaminal lumbar herniated disc; left L4-5 lateral recess and neural foraminal stenosis, lumbar spondylosis, lumbar degenerative disease, left lumbar radiculopathy Active Problems:   HNP (herniated nucleus pulposus), lumbar   Discharged Condition: good  Hospital Course: Patient was admitted, underwent an left L4-5 lumbar laminotomy, left L4-5 extra foraminal exploration, and left L4-5 microdiscectomy. He has done well following surgery, he is up and ambulating actively, he has voided. He is asking to be discharged to home. His wound is healing nicely, there is no erythema, swelling, or drainage. We have given him and his wife instructions regarding wound care and activities following discharge. He is to return for follow-up with me in 3 weeks.  Discharge Exam: Blood pressure 103/61, pulse 73, temperature 97.8 F (36.6 C), resp. rate 18, SpO2 94 %.  Disposition: Home     Medication List    TAKE these medications        HYDROcodone-acetaminophen 5-325 MG per tablet  Commonly known as:  NORCO/VICODIN  Take 1-2 tablets by mouth every 4 (four) hours as needed (mild pain).         Signed: [name],[name], MD 12/24/2014, 8:33 PM

## 2014-12-24 NOTE — Anesthesia Procedure Notes (Signed)
Procedure Name: Intubation Date/Time: 12/24/2014 7:37 AM Performed by: Quentin OreWALKER, Bow Buntyn E Pre-anesthesia Checklist: Patient identified, Emergency Drugs available, Suction available, Patient being monitored and Timeout performed Patient Re-evaluated:Patient Re-evaluated prior to inductionOxygen Delivery Method: Circle system utilized Preoxygenation: Pre-oxygenation with 100% oxygen Intubation Type: IV induction Ventilation: Mask ventilation without difficulty Laryngoscope Size: Mac and 4 Grade View: Grade II Tube type: Oral Tube size: 7.5 mm Number of attempts: 1 Airway Equipment and Method: Stylet Placement Confirmation: ETT inserted through vocal cords under direct vision,  positive ETCO2 and breath sounds checked- equal and bilateral Secured at: 22 cm Tube secured with: Tape Dental Injury: Teeth and Oropharynx as per pre-operative assessment

## 2014-12-25 ENCOUNTER — Encounter (HOSPITAL_COMMUNITY): Payer: Self-pay | Admitting: Neurosurgery

## 2014-12-25 NOTE — Anesthesia Postprocedure Evaluation (Signed)
  Anesthesia Post-op Note  Patient: Douglas Henry  Procedure(s) Performed: Procedure(s) with comments: LEFT LUMBAR FOUR-FIVE LUMBAR LAMINECTOMY/DECOMPRESSION MICRODISCECTOMY  (Left) - Left L45 laminotomy, extraforaminal exploration and microdiskectomy  Patient Location: PACU  Anesthesia Type:General  Level of Consciousness: awake  Airway and Oxygen Therapy: Patient Spontanous Breathing  Post-op Pain: mild  Post-op Assessment: Post-op Vital signs reviewed, Patient's Cardiovascular Status Stable, Respiratory Function Stable, Patent Airway, No signs of Nausea or vomiting and Pain level controlled   LLE Sensation: Full sensation   RLE Sensation: Full sensation      Post-op Vital Signs: Reviewed and stable  Last Vitals:  Filed Vitals:   12/24/14 1938  BP: 103/61  Pulse: 73  Temp: 36.6 C  Resp: 18    Complications: No apparent anesthesia complications

## 2016-11-03 IMAGING — CR DG LUMBAR SPINE 1V
1 series · 1 of 1 positions shown · non-contrast
Comparison: MRI from 11/16/2014.

CLINICAL DATA: L4-5 laminotomy with microdiscectomy.

EXAM:
LUMBAR SPINE - 1 VIEW

[lat]
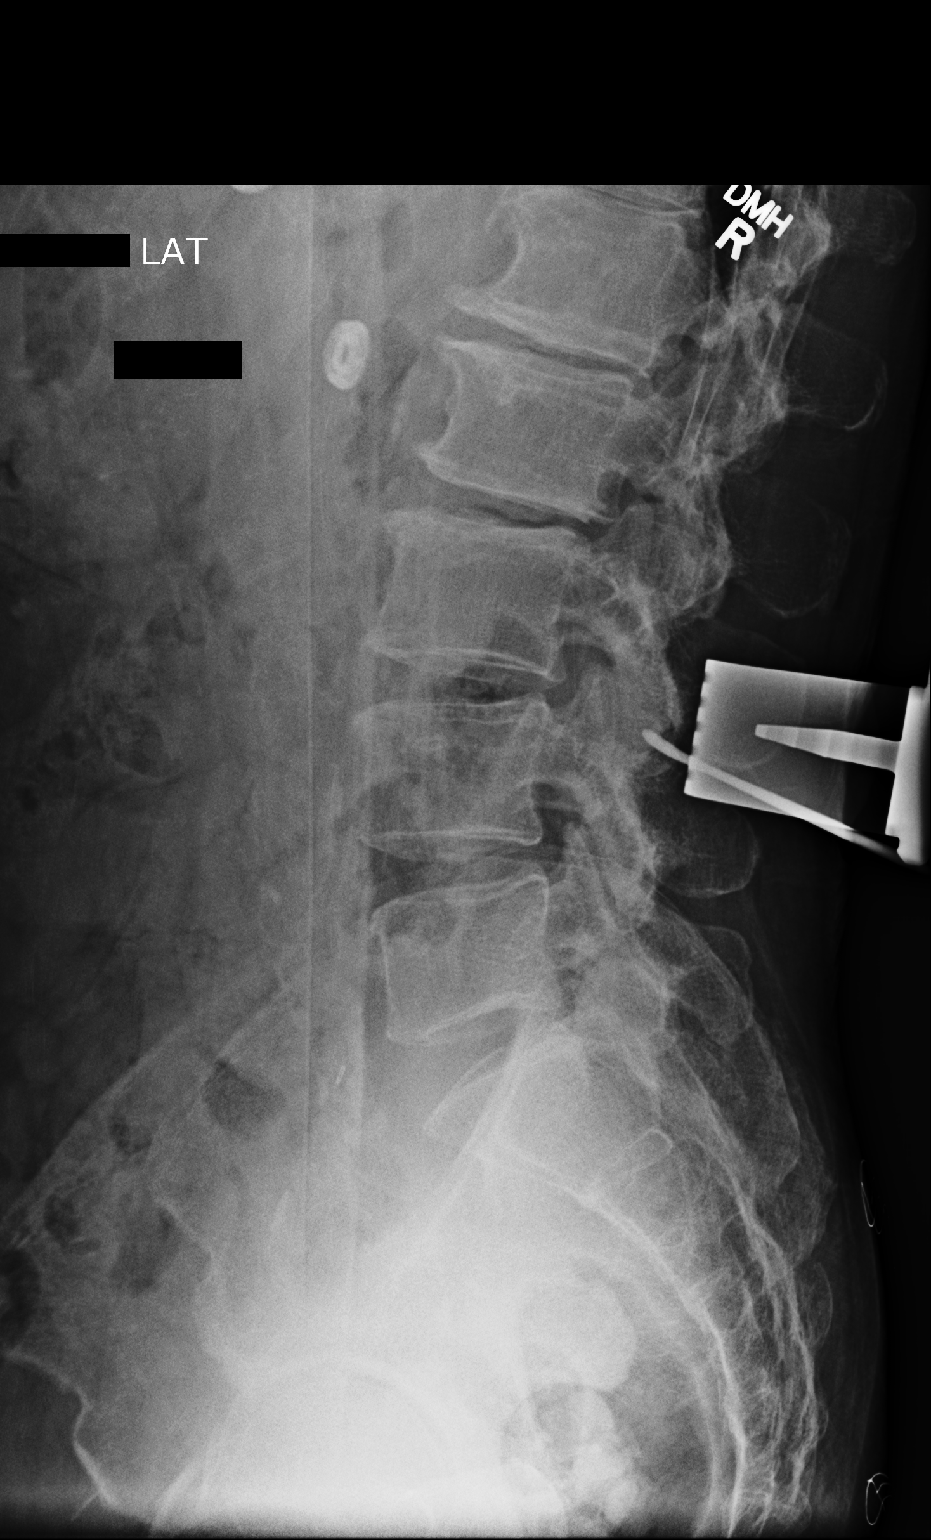

[1 of 1 positions shown; findings below may reference images not displayed]

FINDINGS: Cross-table portable view lumbar spine at 7099 hours shows soft
tissue retractors in the lower back. Same numbering scheme is used
for today's study as was employed on the previous MRI. Radiopaque
surgical probe is positioned at the level of the L3-4 facets.
IMPRESSION: Intraoperative localization.

## 2018-06-28 ENCOUNTER — Encounter: Payer: Self-pay | Admitting: Internal Medicine

## 2018-06-30 ENCOUNTER — Encounter: Payer: Self-pay | Admitting: Internal Medicine

## 2018-07-08 ENCOUNTER — Other Ambulatory Visit (HOSPITAL_COMMUNITY): Payer: Self-pay | Admitting: Internal Medicine

## 2018-07-08 ENCOUNTER — Ambulatory Visit (HOSPITAL_COMMUNITY)
Admission: RE | Admit: 2018-07-08 | Discharge: 2018-07-08 | Disposition: A | Discharge status: Home / Self Care | Payer: BLUE CROSS/BLUE SHIELD | Source: Ambulatory Visit | Attending: Internal Medicine | Admitting: Internal Medicine

## 2018-07-08 ENCOUNTER — Encounter (INDEPENDENT_AMBULATORY_CARE_PROVIDER_SITE_OTHER): Payer: Self-pay | Admitting: Internal Medicine

## 2018-07-08 ENCOUNTER — Ambulatory Visit (INDEPENDENT_AMBULATORY_CARE_PROVIDER_SITE_OTHER): Payer: BLUE CROSS/BLUE SHIELD | Admitting: Internal Medicine

## 2018-07-08 ENCOUNTER — Encounter (HOSPITAL_COMMUNITY): Payer: Self-pay

## 2018-07-08 ENCOUNTER — Other Ambulatory Visit (HOSPITAL_COMMUNITY)
Admission: RE | Admit: 2018-07-08 | Discharge: 2018-07-08 | Disposition: A | Discharge status: Home / Self Care | Payer: BLUE CROSS/BLUE SHIELD | Source: Ambulatory Visit | Attending: Internal Medicine | Admitting: Internal Medicine

## 2018-07-08 VITALS — BP 144/96 | HR 60 | Temp 94.4°F | Ht 72.0 in | Wt 173.9 lb

## 2018-07-08 DIAGNOSIS — R188 Other ascites: Secondary | ICD-10-CM

## 2018-07-08 DIAGNOSIS — R17 Unspecified jaundice: Secondary | ICD-10-CM | POA: Insufficient documentation

## 2018-07-08 LAB — GRAM STAIN

## 2018-07-08 LAB — BODY FLUID CELL COUNT WITH DIFFERENTIAL
Eos, Fluid: 0 %
Lymphs, Fluid: 47 %
Monocyte-Macrophage-Serous Fluid: 46 % — ABNORMAL LOW (ref 50–90)
Neutrophil Count, Fluid: 7 % (ref 0–25)
Other Cells, Fluid: 1 %
WBC FLUID: 306 uL (ref 0–1000)

## 2018-07-08 LAB — IRON AND TIBC
Iron: 72 ug/dL (ref 45–182)
SATURATION RATIOS: 33 % (ref 17.9–39.5)
TIBC: 216 ug/dL — AB (ref 250–450)
UIBC: 144 ug/dL

## 2018-07-08 LAB — HEPATIC FUNCTION PANEL
ALT: 69 U/L — ABNORMAL HIGH (ref 0–44)
AST: 139 U/L — ABNORMAL HIGH (ref 15–41)
Albumin: 2.8 g/dL — ABNORMAL LOW (ref 3.5–5.0)
Alkaline Phosphatase: 171 U/L — ABNORMAL HIGH (ref 38–126)
Bilirubin, Direct: 5.8 mg/dL — ABNORMAL HIGH (ref 0.0–0.2)
Indirect Bilirubin: 4.6 mg/dL — ABNORMAL HIGH (ref 0.3–0.9)
Total Bilirubin: 10.4 mg/dL — ABNORMAL HIGH (ref 0.3–1.2)
Total Protein: 7.7 g/dL (ref 6.5–8.1)

## 2018-07-08 LAB — SEDIMENTATION RATE: Sed Rate: 22 mm/hr — ABNORMAL HIGH (ref 0–16)

## 2018-07-08 LAB — PROTEIN, PLEURAL OR PERITONEAL FLUID: Total protein, fluid: 2.4 g/dL

## 2018-07-08 NOTE — Procedures (Signed)
PreOperative Dx: Jaundice, ascites Postoperative Dx: Jaundice, ascites Procedure:   US guided paracentesis Radiologist:  Tyron Russell Anesthesia:  10 ml of1% lidocaine Specimen:  1.65 L of dark yellow ascitic fluid EBL:   < 1 ml Complications: None

## 2018-07-08 NOTE — Progress Notes (Signed)
   Subjective:    Patient ID: Douglas Henry, male    DOB: 08/15/1955, 63 y.o.   MRN: [id number]  HPI Referred by Dr.Shah for jaundice. Seen by Dr. Sherryll Burger end of December and noted to be jaundiced. Labs ordered.  On 06/28/2018 total bilirubin 11.4, ALP 270, AST 196, ALT 68 Amylase 82, Lipase 141, WBC 16.7, H and H 15.2 and 41.0, Platelet ct 253. AFP 4.4 He was evaluated in the ED and then referred to our office. He is coughing in our office this morning. He says he is SOB. Has been SOB  3 weeks. Abdomen is distended.  He states he would drink 3-4 beers a day. Has not drank since symptoms started.  Never has been a hard drinker (liquor). Has been constipated. Taking Metamucil. He states he has gained from 168 to 174.9 in 2 weeks.  Has been drinking Ensure. Eats no more than 6 ounces.  Once he eats he is miserable.   States 10 yrs ago, he had abdominal pain. He did not have bloating.   06/30/2017 K 2.8, Lipase 150 ,ferritin 1126, Hepatitis C AB negative.  06/30/2017: ct Abdomen/pelvis with CM: abdominal bloating, nausea New hepatomegaly with diffuse inhomogeneous low-density in the liver. Increased ascites. Possibility of acute hepatitis superimposed on hepatic steatosis should be considered. Inhomogeneous density in the dependent portion of the GB could represent sludge or stones.     Review of Systems Past Medical History:  Diagnosis Date  . Medical history non-contributory   . Skin cysts, generalized     Past Surgical History:  Procedure Laterality Date  . JOINT REPLACEMENT     scope  rt knee  . LUMBAR LAMINECTOMY/DECOMPRESSION MICRODISCECTOMY Left 12/24/2014   Procedure: LEFT LUMBAR FOUR-FIVE LUMBAR LAMINECTOMY/DECOMPRESSION MICRODISCECTOMY ;  Surgeon: Shirlean Kelly, MD;  Location: [facility];  Service: Neurosurgery;  Laterality: Left;  Left L45 laminotomy, extraforaminal exploration and microdiskectomy  . MECKEL DIVERTICULUM EXCISION    . ROTATOR CUFF REPAIR    . sagill    spilt      Allergies  Allergen Reactions  . Darvon [Propoxyphene] Nausea Only  . Lexapro [Escitalopram Oxalate] Nausea Only    Current Outpatient Medications on File Prior to Visit  Medication Sig Dispense Refill  . ibuprofen (ADVIL,MOTRIN) 200 MG tablet Take 200 mg by mouth every 6 (six) hours as needed.    . naproxen sodium (ALEVE) 220 MG tablet Take 220 mg by mouth.     No current facility-administered medications on file prior to visit.         Objective:   Physical Exam Blood pressure (!) 144/96, pulse 60, temperature (!) 94.4 F (34.7 C), height 6' (1.829 m), weight 173 lb 14.4 oz (78.9 kg). Alert and oriented. Skin warm and dry. Oral mucosa is moist.   . Sclera icteric, conjunctivae is pink. Thyroid not enlarged. No cervical lymphadenopathy. Lungs clear. Heart regular rate and rhythm.  Abdomen is soft. Bowel sounds are positive. No hepatomegaly. No abdominal masses felt. No tenderness.  No edema to lower extremities. Abdomen is distended. Abdomen: appears yellow.          Assessment & Plan:  Jaundice. Abdominal distention. Suspect ascites.  US paracentesis today. Will also get labs to rule out auto immune process

## 2018-07-08 NOTE — Progress Notes (Signed)
Paracentesis complete no signs of distress.  

## 2018-07-08 NOTE — Patient Instructions (Signed)
US paracentesis. Labs

## 2018-07-09 LAB — ALPHA-1-ANTITRYPSIN: A-1 Antitrypsin, Ser: 250 mg/dL — ABNORMAL HIGH (ref 101–187)

## 2018-07-09 LAB — ACID FAST SMEAR (AFB, MYCOBACTERIA): Acid Fast Smear: NEGATIVE

## 2018-07-09 LAB — HEPATITIS PANEL, ACUTE
HCV Ab: 0.2 s/co ratio (ref 0.0–0.9)
Hep A IgM: NEGATIVE
Hep B C IgM: NEGATIVE
Hepatitis B Surface Ag: NEGATIVE

## 2018-07-09 LAB — ANA: Anti Nuclear Antibody(ANA): NEGATIVE

## 2018-07-09 LAB — CERULOPLASMIN: Ceruloplasmin: 48.2 mg/dL — ABNORMAL HIGH (ref 16.0–31.0)

## 2018-07-10 LAB — MITOCHONDRIAL ANTIBODIES: Mitochondrial M2 Ab, IgG: <20 Units (ref 0.0–20.0)

## 2018-07-11 ENCOUNTER — Other Ambulatory Visit (INDEPENDENT_AMBULATORY_CARE_PROVIDER_SITE_OTHER): Payer: Self-pay | Admitting: *Deleted

## 2018-07-11 ENCOUNTER — Encounter (INDEPENDENT_AMBULATORY_CARE_PROVIDER_SITE_OTHER): Payer: Self-pay | Admitting: *Deleted

## 2018-07-11 ENCOUNTER — Telehealth (INDEPENDENT_AMBULATORY_CARE_PROVIDER_SITE_OTHER): Payer: Self-pay | Admitting: Internal Medicine

## 2018-07-11 DIAGNOSIS — R188 Other ascites: Secondary | ICD-10-CM

## 2018-07-11 DIAGNOSIS — R17 Unspecified jaundice: Secondary | ICD-10-CM

## 2018-07-11 NOTE — Telephone Encounter (Signed)
Hepatic noted for 1 week and a letter has been sent as a reminder to the patient.

## 2018-07-11 NOTE — Telephone Encounter (Signed)
Tammy, Hepatic in 1 week. Please send letter.

## 2018-07-13 ENCOUNTER — Other Ambulatory Visit (INDEPENDENT_AMBULATORY_CARE_PROVIDER_SITE_OTHER): Payer: Self-pay | Admitting: *Deleted

## 2018-07-13 ENCOUNTER — Encounter (INDEPENDENT_AMBULATORY_CARE_PROVIDER_SITE_OTHER): Payer: Self-pay | Admitting: *Deleted

## 2018-07-13 ENCOUNTER — Telehealth (INDEPENDENT_AMBULATORY_CARE_PROVIDER_SITE_OTHER): Payer: Self-pay | Admitting: Internal Medicine

## 2018-07-13 DIAGNOSIS — K7011 Alcoholic hepatitis with ascites: Secondary | ICD-10-CM

## 2018-07-13 DIAGNOSIS — R17 Unspecified jaundice: Secondary | ICD-10-CM

## 2018-07-13 LAB — CULTURE, BODY FLUID W GRAM STAIN -BOTTLE: Culture: NO GROWTH

## 2018-07-13 MED ORDER — SPIRONOLACTONE 100 MG PO TABS: 100.0000 mg | ORAL_TABLET | Freq: Every day | ORAL | 3 refills | Status: DC

## 2018-07-13 MED ORDER — FUROSEMIDE 20 MG PO TABS: 20.0000 mg | ORAL_TABLET | Freq: Every day | ORAL | 3 refills | Status: DC

## 2018-07-13 NOTE — Telephone Encounter (Signed)
Lasix 20mg  and Spironolactone 100mg  ordered.

## 2018-07-13 NOTE — Telephone Encounter (Signed)
C-Met noted and a letter has been sent to the patient as a reminder.

## 2018-07-13 NOTE — Telephone Encounter (Signed)
err

## 2018-07-13 NOTE — Telephone Encounter (Signed)
Tammy, cmet in 2 weeks. Please send letter.

## 2018-07-13 NOTE — Telephone Encounter (Signed)
Labs ordered.

## 2018-07-14 LAB — CBC WITH DIFFERENTIAL/PLATELET
ABSOLUTE MONOCYTES: 1162 {cells}/uL — AB (ref 200–950)
Basophils Absolute: 106 cells/uL (ref 0–200)
Basophils Relative: 0.8 %
Eosinophils Absolute: 145 cells/uL (ref 15–500)
Eosinophils Relative: 1.1 %
HCT: 44.7 % (ref 38.5–50.0)
Hemoglobin: 15.8 g/dL (ref 13.2–17.1)
Lymphs Abs: 1518 cells/uL (ref 850–3900)
MCH: 35.1 pg — ABNORMAL HIGH (ref 27.0–33.0)
MCHC: 35.3 g/dL (ref 32.0–36.0)
MCV: 99.3 fL (ref 80.0–100.0)
MPV: 11.7 fL (ref 7.5–12.5)
Monocytes Relative: 8.8 %
Neutro Abs: 10270 cells/uL — ABNORMAL HIGH (ref 1500–7800)
Neutrophils Relative %: 77.8 %
Platelets: 368 10*3/uL (ref 140–400)
RBC: 4.5 10*6/uL (ref 4.20–5.80)
RDW: 12.7 % (ref 11.0–15.0)
Total Lymphocyte: 11.5 %
WBC: 13.2 10*3/uL — AB (ref 3.8–10.8)

## 2018-07-14 LAB — HEPATIC FUNCTION PANEL
AG Ratio: 0.9 (calc) — ABNORMAL LOW (ref 1.0–2.5)
ALT: 63 U/L — ABNORMAL HIGH (ref 9–46)
AST: 107 U/L — AB (ref 10–35)
Albumin: 3.3 g/dL — ABNORMAL LOW (ref 3.6–5.1)
Alkaline phosphatase (APISO): 171 U/L — ABNORMAL HIGH (ref 40–115)
BILIRUBIN TOTAL: 6 mg/dL — AB (ref 0.2–1.2)
Bilirubin, Direct: 3.1 mg/dL — ABNORMAL HIGH (ref 0.0–0.2)
Globulin: 3.8 g/dL (calc) — ABNORMAL HIGH (ref 1.9–3.7)
Indirect Bilirubin: 2.9 mg/dL (calc) — ABNORMAL HIGH (ref 0.2–1.2)
Total Protein: 7.1 g/dL (ref 6.1–8.1)

## 2018-07-14 LAB — PROTIME-INR
INR: 1.1
Prothrombin Time: 11.1 s (ref 9.0–11.5)

## 2018-07-15 ENCOUNTER — Other Ambulatory Visit (INDEPENDENT_AMBULATORY_CARE_PROVIDER_SITE_OTHER): Payer: Self-pay | Admitting: *Deleted

## 2018-07-15 DIAGNOSIS — K7011 Alcoholic hepatitis with ascites: Secondary | ICD-10-CM

## 2018-07-25 LAB — HEPATIC FUNCTION PANEL
AG Ratio: 1 (calc) (ref 1.0–2.5)
ALT: 36 U/L (ref 9–46)
AST: 49 U/L — ABNORMAL HIGH (ref 10–35)
Albumin: 3.9 g/dL (ref 3.6–5.1)
Alkaline phosphatase (APISO): 122 U/L — ABNORMAL HIGH (ref 40–115)
Bilirubin, Direct: 1.4 mg/dL — ABNORMAL HIGH (ref 0.0–0.2)
Globulin: 3.8 g/dL (calc) — ABNORMAL HIGH (ref 1.9–3.7)
Indirect Bilirubin: 1.3 mg/dL (calc) — ABNORMAL HIGH (ref 0.2–1.2)
Total Bilirubin: 2.7 mg/dL — ABNORMAL HIGH (ref 0.2–1.2)
Total Protein: 7.7 g/dL (ref 6.1–8.1)

## 2018-07-27 ENCOUNTER — Telehealth (INDEPENDENT_AMBULATORY_CARE_PROVIDER_SITE_OTHER): Payer: Self-pay | Admitting: Internal Medicine

## 2018-07-27 DIAGNOSIS — R188 Other ascites: Secondary | ICD-10-CM

## 2018-07-27 NOTE — Telephone Encounter (Signed)
Douglas Henry, US paracentesis 

## 2018-07-28 ENCOUNTER — Other Ambulatory Visit (INDEPENDENT_AMBULATORY_CARE_PROVIDER_SITE_OTHER): Payer: Self-pay | Admitting: *Deleted

## 2018-07-28 ENCOUNTER — Encounter (INDEPENDENT_AMBULATORY_CARE_PROVIDER_SITE_OTHER): Payer: Self-pay | Admitting: *Deleted

## 2018-07-28 DIAGNOSIS — K7011 Alcoholic hepatitis with ascites: Secondary | ICD-10-CM

## 2018-07-28 NOTE — Telephone Encounter (Signed)
[place] para sch'd 08/03/18 at 900 ([phone]), patient aware

## 2018-08-03 ENCOUNTER — Other Ambulatory Visit (INDEPENDENT_AMBULATORY_CARE_PROVIDER_SITE_OTHER): Payer: Self-pay | Admitting: Internal Medicine

## 2018-08-03 ENCOUNTER — Ambulatory Visit (HOSPITAL_COMMUNITY)
Admission: RE | Admit: 2018-08-03 | Discharge: 2018-08-03 | Disposition: A | Discharge status: Home / Self Care | Payer: BLUE CROSS/BLUE SHIELD | Source: Ambulatory Visit | Attending: Internal Medicine | Admitting: Internal Medicine

## 2018-08-03 DIAGNOSIS — R188 Other ascites: Secondary | ICD-10-CM | POA: Insufficient documentation

## 2018-08-12 LAB — HEPATIC FUNCTION PANEL
AG Ratio: 1.2 (calc) (ref 1.0–2.5)
ALT: 28 U/L (ref 9–46)
AST: 30 U/L (ref 10–35)
Albumin: 4.2 g/dL (ref 3.6–5.1)
Alkaline phosphatase (APISO): 76 U/L (ref 35–144)
Bilirubin, Direct: 0.7 mg/dL — ABNORMAL HIGH (ref 0.0–0.2)
Globulin: 3.6 g/dL (calc) (ref 1.9–3.7)
Indirect Bilirubin: 0.9 mg/dL (calc) (ref 0.2–1.2)
Total Bilirubin: 1.6 mg/dL — ABNORMAL HIGH (ref 0.2–1.2)
Total Protein: 7.8 g/dL (ref 6.1–8.1)

## 2018-08-15 ENCOUNTER — Telehealth (INDEPENDENT_AMBULATORY_CARE_PROVIDER_SITE_OTHER): Payer: Self-pay | Admitting: Internal Medicine

## 2018-08-15 ENCOUNTER — Other Ambulatory Visit (INDEPENDENT_AMBULATORY_CARE_PROVIDER_SITE_OTHER): Payer: Self-pay | Admitting: *Deleted

## 2018-08-15 DIAGNOSIS — K7011 Alcoholic hepatitis with ascites: Secondary | ICD-10-CM

## 2018-08-15 NOTE — Telephone Encounter (Signed)
err

## 2018-08-21 LAB — ACID FAST CULTURE WITH REFLEXED SENSITIVITIES (MYCOBACTERIA): Acid Fast Culture: NEGATIVE

## 2018-08-24 ENCOUNTER — Encounter (INDEPENDENT_AMBULATORY_CARE_PROVIDER_SITE_OTHER): Payer: Self-pay | Admitting: *Deleted

## 2018-08-24 ENCOUNTER — Other Ambulatory Visit (INDEPENDENT_AMBULATORY_CARE_PROVIDER_SITE_OTHER): Payer: Self-pay | Admitting: *Deleted

## 2018-08-24 DIAGNOSIS — K7011 Alcoholic hepatitis with ascites: Secondary | ICD-10-CM

## 2018-09-03 ENCOUNTER — Other Ambulatory Visit (INDEPENDENT_AMBULATORY_CARE_PROVIDER_SITE_OTHER): Payer: Self-pay | Admitting: Internal Medicine

## 2018-09-03 DIAGNOSIS — K7011 Alcoholic hepatitis with ascites: Secondary | ICD-10-CM

## 2018-09-26 LAB — HEPATIC FUNCTION PANEL
AG Ratio: 1.5 (calc) (ref 1.0–2.5)
ALT: 32 U/L (ref 9–46)
AST: 29 U/L (ref 10–35)
Albumin: 4.7 g/dL (ref 3.6–5.1)
Alkaline phosphatase (APISO): 71 U/L (ref 35–144)
BILIRUBIN DIRECT: 0.1 mg/dL (ref 0.0–0.2)
BILIRUBIN INDIRECT: 0.5 mg/dL (ref 0.2–1.2)
Globulin: 3.2 g/dL (calc) (ref 1.9–3.7)
Total Bilirubin: 0.6 mg/dL (ref 0.2–1.2)
Total Protein: 7.9 g/dL (ref 6.1–8.1)

## 2018-09-27 ENCOUNTER — Telehealth (INDEPENDENT_AMBULATORY_CARE_PROVIDER_SITE_OTHER): Payer: Self-pay | Admitting: Internal Medicine

## 2018-09-27 DIAGNOSIS — R748 Abnormal levels of other serum enzymes: Secondary | ICD-10-CM

## 2018-09-27 NOTE — Telephone Encounter (Signed)
Ferritin and ceruloplasmin ordered.

## 2018-09-30 LAB — CERULOPLASMIN: Ceruloplasmin: 27 mg/dL (ref 18–36)

## 2018-09-30 LAB — FERRITIN: Ferritin: 91 ng/mL (ref 24–380)

## 2018-11-05 ENCOUNTER — Other Ambulatory Visit (INDEPENDENT_AMBULATORY_CARE_PROVIDER_SITE_OTHER): Payer: Self-pay | Admitting: Internal Medicine

## 2018-11-05 DIAGNOSIS — K7011 Alcoholic hepatitis with ascites: Secondary | ICD-10-CM

## 2020-05-16 NOTE — H&P (Signed)
Surgical History & Physical  Patient Name: Douglas Henry DOB: Mar 25, 1956  Surgery: Cataract extraction with intraocular lens implant phacoemulsification; Left Eye  Surgeon: [name] MD Surgery Date:  05/31/2020 Pre-Op Date:  05/16/2020  HPI: A 40 Yr. old male patient 1. 1. Pt referred from Dr. Daphine Deutscher for Cataract Evaluation. The patient complains of nighttime light - car headlights, street lamps etc. glare causing poor vision, which began many years ago. Both eyes are affected. The left eye is worse. The episode is gradual. The condition's severity decreased since last visit. Symptoms occur when the patient is driving and reading, and pt states this is negatively affecting his quality of life. . The complaint is associated with blurry vision and glare. The patient experiences no dryness, no eye pain and no flashes, floater, shadow, curtain or veil. HPI was performed by [name] .  Medical History: Cataracts Hyperopia OU  Review of Systems Negative Allergic/Immunologic Negative Cardiovascular Negative Constitutional Negative Ear, Nose, Mouth & Throat Negative Endocrine Negative Eyes Negative Gastrointestinal Negative Genitourinary Negative Hemotologic/Lymphatic Negative Integumentary Negative Musculoskeletal Negative Neurological Negative Psychiatry Negative Respiratory  Social   Current every day smoker   Medication Advil, Ibuprofen,   Sx/Procedures Back Surgery,   Drug Allergies  Darvocet-N,   History & Physical: Heent:  Cataract, Left eye NECK: supple without bruits LUNGS: lungs clear to auscultation CV: regular rate and rhythm Abdomen: soft and non-tender  Impression & Plan: Assessment: 1.  COMBINED FORMS AGE RELATED CATARACT; Both Eyes (H25.813) 2.  BLEPHARITIS; Right Upper Lid, Right Lower Lid, Left Upper Lid, Left Lower Lid (H01.001, H01.002,H01.004,H01.005) 3.  DERMATOCHALASIS, no surgery; Right Upper Lid, Left Upper Lid (H02.831,  Y07.371)  Plan: 1.  Cataract accounts for the patient's decreased vision. This visual impairment is not correctable with a tolerable change in glasses or contact lenses. Cataract surgery with an implantation of a new lens should significantly improve the visual and functional status of the patient. Discussed all risks, benefits, alternatives, and potential complications. Discussed the procedures and recovery. Patient desires to have surgery. A-scan ordered and performed today for intra-ocular lens calculations. The surgery will be performed in order to improve vision for driving, reading, and for eye examinations. Recommend phacoemulsification with intra-ocular lens. Recommend Dextenza for post-operative pain and inflammation. Left Eye worse - first. Dilates well - shugarcaine by protocol. Consider Vivity Lens - corneal findings not a candidate for diffractive lens. 2.  Begin/continue lid scrubs. 3.  Asymptomatic, recommend observation for now. Findings, prognosis and treatment options reviewed.

## 2020-05-18 IMAGING — US US PARACENTESIS
1 series · 4 of 4 positions shown · non-contrast
Comparison: none

INDICATION: New onset jaundice, ascites

[Series 1: us paracentesis · 4 of 4 slices shown]
[im 1/4]
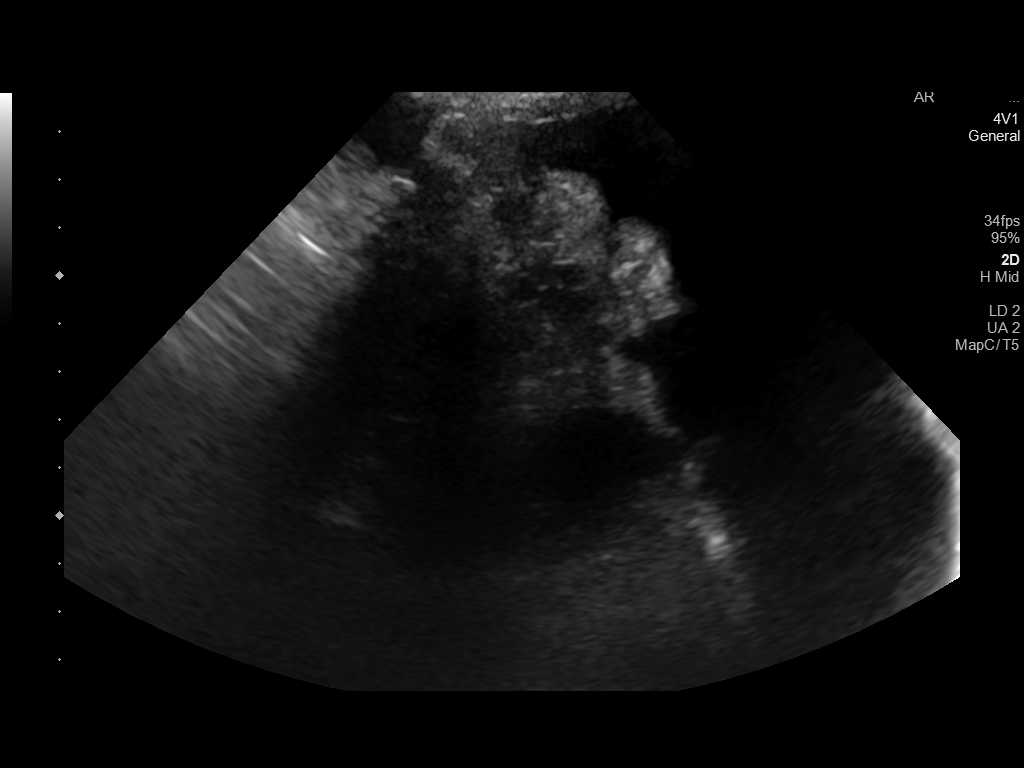
[im 2/4]
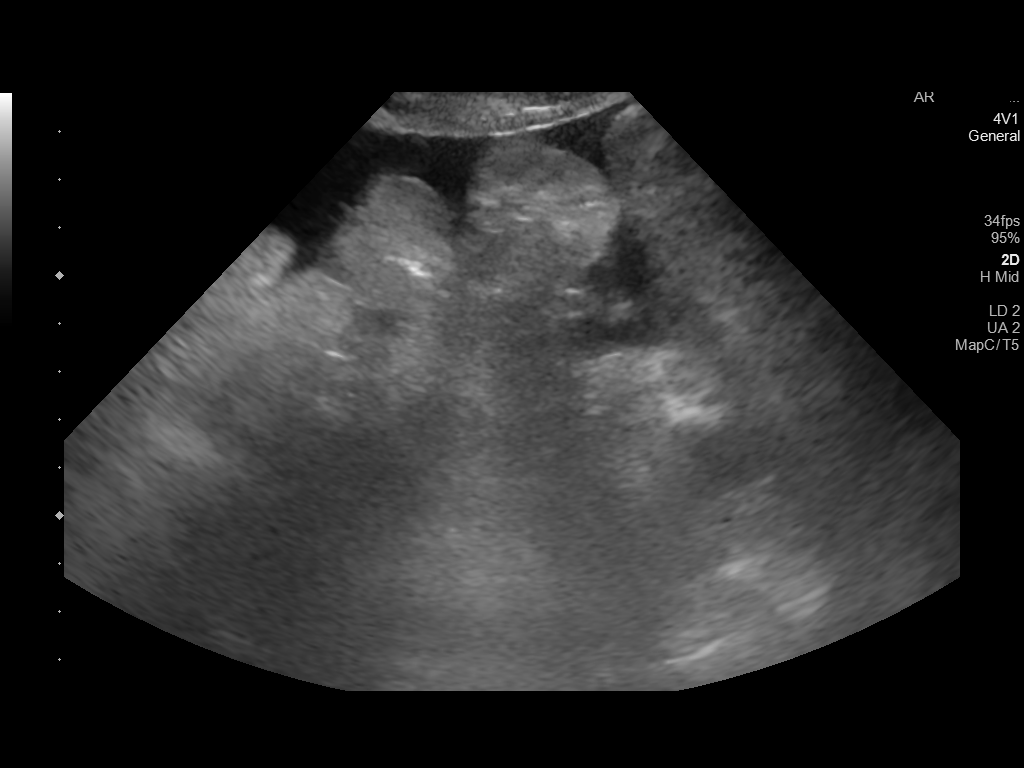
[im 3/4]
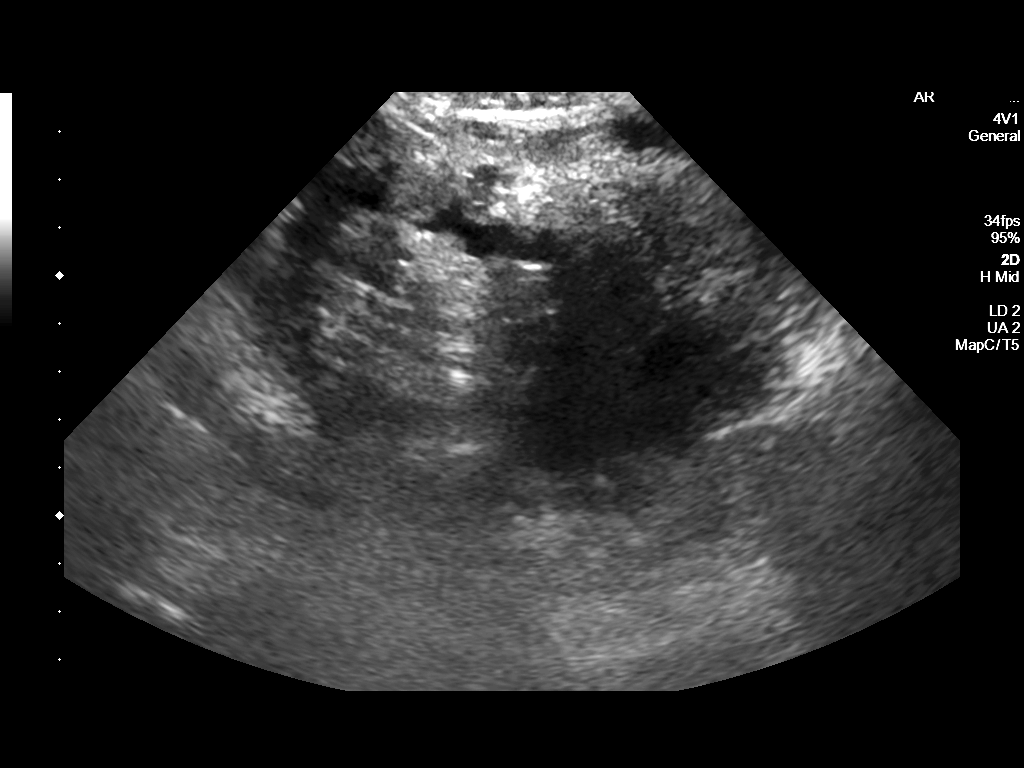
[im 4/4]
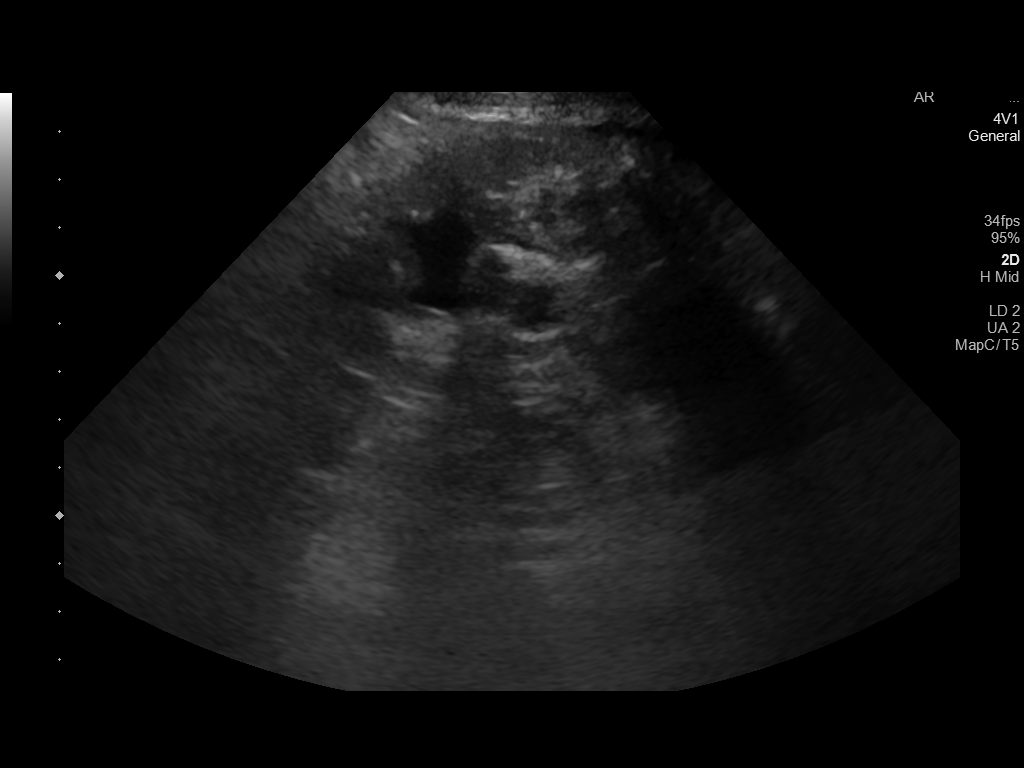

[4 of 4 positions shown; findings below may reference images not displayed]

EXAM:
ULTRASOUND GUIDED DIAGNOSTIC AND THERAPEUTIC PARACENTESIS

MEDICATIONS:
None.

COMPLICATIONS:
None immediate.

PROCEDURE:
Procedure, benefits, and risks of procedure were discussed with
patient.

Written informed consent for procedure was obtained.

Time out protocol followed.

Adequate collection of ascites localized by ultrasound in RIGHT
lower quadrant.

Skin prepped and draped in usual sterile fashion.

Skin and soft tissues anesthetized with 10 mL of 1% lidocaine.

5 French Yueh catheter placed into peritoneal cavity.

1.65 L of slightly cloudy dark yellow ascitic fluid aspirated by
vacuum bottle suction.

Procedure tolerated well by patient without immediate complication.
FINDINGS: A total of approximately 1.65 L of ascitic fluid was removed.

Samples were sent to the laboratory as requested by the clinical
team.
IMPRESSION: Successful ultrasound-guided paracentesis yielding 1.65 liters of
peritoneal fluid.

## 2020-05-28 ENCOUNTER — Other Ambulatory Visit: Payer: Self-pay

## 2020-05-28 ENCOUNTER — Encounter (HOSPITAL_COMMUNITY): Payer: Self-pay

## 2020-05-28 ENCOUNTER — Encounter (HOSPITAL_COMMUNITY)
Admission: RE | Admit: 2020-05-28 | Discharge: 2020-05-28 | Disposition: A | Discharge status: Home / Self Care | Payer: BC Managed Care – PPO | Source: Ambulatory Visit | Attending: Ophthalmology | Admitting: Ophthalmology

## 2020-05-29 ENCOUNTER — Other Ambulatory Visit: Payer: Self-pay

## 2020-05-29 ENCOUNTER — Other Ambulatory Visit (HOSPITAL_COMMUNITY)
Admission: RE | Admit: 2020-05-29 | Discharge: 2020-05-29 | Disposition: A | Discharge status: Home / Self Care | Payer: BC Managed Care – PPO | Source: Ambulatory Visit | Attending: Ophthalmology | Admitting: Ophthalmology

## 2020-05-29 DIAGNOSIS — H02831 Dermatochalasis of right upper eyelid: Secondary | ICD-10-CM | POA: Diagnosis not present

## 2020-05-29 DIAGNOSIS — Z01812 Encounter for preprocedural laboratory examination: Secondary | ICD-10-CM | POA: Insufficient documentation

## 2020-05-29 DIAGNOSIS — H0100A Unspecified blepharitis right eye, upper and lower eyelids: Secondary | ICD-10-CM | POA: Diagnosis not present

## 2020-05-29 DIAGNOSIS — Z791 Long term (current) use of non-steroidal anti-inflammatories (NSAID): Secondary | ICD-10-CM | POA: Diagnosis not present

## 2020-05-29 DIAGNOSIS — H0100B Unspecified blepharitis left eye, upper and lower eyelids: Secondary | ICD-10-CM | POA: Diagnosis not present

## 2020-05-29 DIAGNOSIS — H25813 Combined forms of age-related cataract, bilateral: Secondary | ICD-10-CM | POA: Diagnosis not present

## 2020-05-29 DIAGNOSIS — Z20822 Contact with and (suspected) exposure to covid-19: Secondary | ICD-10-CM | POA: Insufficient documentation

## 2020-05-29 DIAGNOSIS — F17200 Nicotine dependence, unspecified, uncomplicated: Secondary | ICD-10-CM | POA: Diagnosis not present

## 2020-05-29 DIAGNOSIS — H02834 Dermatochalasis of left upper eyelid: Secondary | ICD-10-CM | POA: Diagnosis not present

## 2020-05-29 DIAGNOSIS — H25812 Combined forms of age-related cataract, left eye: Secondary | ICD-10-CM | POA: Diagnosis present

## 2020-05-29 LAB — SARS CORONAVIRUS 2 (TAT 6-24 HRS): SARS Coronavirus 2: NEGATIVE

## 2020-05-31 ENCOUNTER — Ambulatory Visit (HOSPITAL_COMMUNITY): Payer: BC Managed Care – PPO | Admitting: Anesthesiology

## 2020-05-31 ENCOUNTER — Encounter (HOSPITAL_COMMUNITY): Payer: Self-pay | Admitting: Ophthalmology

## 2020-05-31 ENCOUNTER — Ambulatory Visit (HOSPITAL_COMMUNITY)
Admission: RE | Admit: 2020-05-31 | Discharge: 2020-05-31 | Disposition: A | Discharge status: Home / Self Care | Payer: BC Managed Care – PPO | Source: Ambulatory Visit | Attending: Ophthalmology | Admitting: Ophthalmology

## 2020-05-31 ENCOUNTER — Encounter (HOSPITAL_COMMUNITY)
Admission: RE | Disposition: A | Discharge status: Home / Self Care | Payer: Self-pay | Source: Ambulatory Visit | Attending: Ophthalmology

## 2020-05-31 DIAGNOSIS — Z791 Long term (current) use of non-steroidal anti-inflammatories (NSAID): Secondary | ICD-10-CM | POA: Insufficient documentation

## 2020-05-31 DIAGNOSIS — H25813 Combined forms of age-related cataract, bilateral: Secondary | ICD-10-CM | POA: Insufficient documentation

## 2020-05-31 DIAGNOSIS — H02834 Dermatochalasis of left upper eyelid: Secondary | ICD-10-CM | POA: Insufficient documentation

## 2020-05-31 DIAGNOSIS — H0100B Unspecified blepharitis left eye, upper and lower eyelids: Secondary | ICD-10-CM | POA: Insufficient documentation

## 2020-05-31 DIAGNOSIS — H0100A Unspecified blepharitis right eye, upper and lower eyelids: Secondary | ICD-10-CM | POA: Insufficient documentation

## 2020-05-31 DIAGNOSIS — H02831 Dermatochalasis of right upper eyelid: Secondary | ICD-10-CM | POA: Insufficient documentation

## 2020-05-31 DIAGNOSIS — F17200 Nicotine dependence, unspecified, uncomplicated: Secondary | ICD-10-CM | POA: Insufficient documentation

## 2020-05-31 DIAGNOSIS — Z20822 Contact with and (suspected) exposure to covid-19: Secondary | ICD-10-CM | POA: Insufficient documentation

## 2020-05-31 HISTORY — PX: CATARACT EXTRACTION W/PHACO: SHX586

## 2020-05-31 SURGERY — PHACOEMULSIFICATION, CATARACT, WITH IOL INSERTION
Anesthesia: Monitor Anesthesia Care | Site: Eye | Laterality: Left

## 2020-05-31 MED ORDER — EPINEPHRINE PF 1 MG/ML IJ SOLN
INTRAOCULAR | Status: DC | PRN
  Administered 2020-05-31: 500 mL

## 2020-05-31 MED ORDER — BSS IO SOLN
INTRAOCULAR | Status: DC | PRN
  Administered 2020-05-31: 15 mL via INTRAOCULAR

## 2020-05-31 MED ORDER — POVIDONE-IODINE 5 % OP SOLN
OPHTHALMIC | Status: DC | PRN
  Administered 2020-05-31: 1 via OPHTHALMIC

## 2020-05-31 MED ORDER — PROVISC 10 MG/ML IO SOLN
INTRAOCULAR | Status: DC | PRN
  Administered 2020-05-31: 0.85 mL via INTRAOCULAR

## 2020-05-31 MED ORDER — TETRACAINE HCL 0.5 % OP SOLN
1.0000 [drp] | OPHTHALMIC | Status: AC | PRN
  Administered 2020-05-31 (×3): 1 [drp] via OPHTHALMIC

## 2020-05-31 MED ORDER — CYCLOPENTOLATE-PHENYLEPHRINE 0.2-1 % OP SOLN
1.0000 [drp] | OPHTHALMIC | Status: AC | PRN
  Administered 2020-05-31 (×3): 1 [drp] via OPHTHALMIC

## 2020-05-31 MED ORDER — LIDOCAINE HCL (PF) 1 % IJ SOLN
INTRAOCULAR | Status: DC | PRN
  Administered 2020-05-31: 1 mL via OPHTHALMIC

## 2020-05-31 MED ORDER — LIDOCAINE HCL 3.5 % OP GEL
1.0000 "application " | Freq: Once | OPHTHALMIC | Status: AC
  Administered 2020-05-31: 1 via OPHTHALMIC

## 2020-05-31 MED ORDER — STERILE WATER FOR IRRIGATION IR SOLN
Status: DC | PRN
  Administered 2020-05-31: 250 mL

## 2020-05-31 MED ORDER — NEOMYCIN-POLYMYXIN-DEXAMETH 3.5-10000-0.1 OP SUSP
OPHTHALMIC | Status: DC | PRN
  Administered 2020-05-31: 1 [drp] via OPHTHALMIC

## 2020-05-31 MED ORDER — PHENYLEPHRINE HCL 2.5 % OP SOLN
1.0000 [drp] | OPHTHALMIC | Status: AC | PRN
  Administered 2020-05-31 (×3): 1 [drp] via OPHTHALMIC

## 2020-05-31 MED ORDER — EPINEPHRINE PF 1 MG/ML IJ SOLN
INTRAMUSCULAR | Status: AC
  Filled 2020-05-31: qty 2

## 2020-05-31 MED ORDER — SODIUM HYALURONATE 23 MG/ML IO SOLN
INTRAOCULAR | Status: DC | PRN
  Administered 2020-05-31: 0.6 mL via INTRAOCULAR

## 2020-05-31 SURGICAL SUPPLY — 19 items
CLOTH BEACON ORANGE TIMEOUT ST (SAFETY) ×1 IMPLANT
EYE SHIELD UNIVERSAL CLEAR (GAUZE/BANDAGES/DRESSINGS) ×1 IMPLANT
GLOVE BIOGEL PI IND STRL 6.5 (GLOVE) IMPLANT
GLOVE BIOGEL PI IND STRL 7.0 (GLOVE) IMPLANT
GLOVE BIOGEL PI INDICATOR 6.5 (GLOVE)
GLOVE BIOGEL PI INDICATOR 7.0 (GLOVE) ×2
GOWN STRL REUS W/ TWL XL LVL3 (GOWN DISPOSABLE) IMPLANT
GOWN STRL REUS W/TWL XL LVL3 (GOWN DISPOSABLE) ×2
NDL HYPO 18GX1.5 BLUNT FILL (NEEDLE) IMPLANT
NEEDLE HYPO 18GX1.5 BLUNT FILL (NEEDLE) ×2 IMPLANT
PAD ARMBOARD 7.5X6 YLW CONV (MISCELLANEOUS) ×1 IMPLANT
PROC W SPEC LENS (INTRAOCULAR LENS)
PROCESS W SPEC LENS (INTRAOCULAR LENS) IMPLANT
SYR TB 1ML LL NO SAFETY (SYRINGE) ×1 IMPLANT
TAPE SURG TRANSPORE 1 IN (GAUZE/BANDAGES/DRESSINGS) IMPLANT
TAPE SURGICAL TRANSPORE 1 IN (GAUZE/BANDAGES/DRESSINGS) ×2
VISCOELASTIC ADDITIONAL (OPHTHALMIC RELATED) IMPLANT
VIVITY IOL (Intraocular Lens) ×1 IMPLANT
WATER STERILE IRR 250ML POUR (IV SOLUTION) ×1 IMPLANT

## 2020-05-31 NOTE — Anesthesia Preprocedure Evaluation (Signed)
Anesthesia Evaluation  Patient identified by MRN, date of birth, ID band Patient awake    Reviewed: Allergy & Precautions, H&P , NPO status , Patient's Chart, lab work & pertinent test results, reviewed documented beta blocker date and time   Airway Mallampati: II  TM Distance: >3 FB Neck ROM: full    Dental no notable dental hx. (+) Teeth Intact   Pulmonary neg pulmonary ROS, Current Smoker,    Pulmonary exam normal breath sounds clear to auscultation       Cardiovascular Exercise Tolerance: Good negative cardio ROS   Rhythm:regular Rate:Normal     Neuro/Psych negative neurological ROS  negative psych ROS   GI/Hepatic negative GI ROS, Neg liver ROS,   Endo/Other  negative endocrine ROS  Renal/GU negative Renal ROS  negative genitourinary   Musculoskeletal   Abdominal   Peds  Hematology negative hematology ROS (+)   Anesthesia Other Findings   Reproductive/Obstetrics negative OB ROS                             Anesthesia Physical Anesthesia Plan  ASA: II  Anesthesia Plan: MAC   Post-op Pain Management:    Induction:   PONV Risk Score and Plan:   Airway Management Planned:   Additional Equipment:   Intra-op Plan:   Post-operative Plan:   Informed Consent: I have reviewed the patients History and Physical, chart, labs and discussed the procedure including the risks, benefits and alternatives for the proposed anesthesia with the patient or authorized representative who has indicated his/her understanding and acceptance.     Dental Advisory Given  Plan Discussed with: CRNA  Anesthesia Plan Comments:         Anesthesia Quick Evaluation

## 2020-05-31 NOTE — Anesthesia Postprocedure Evaluation (Signed)
Anesthesia Post Note  Patient: Douglas Henry  Procedure(s) Performed: CATARACT EXTRACTION PHACO AND INTRAOCULAR LENS PLACEMENT (IOC) (Left Eye)  Patient location during evaluation: Short Stay Anesthesia Type: MAC Level of consciousness: awake and alert Pain management: pain level controlled Vital Signs Assessment: post-procedure vital signs reviewed and stable Respiratory status: spontaneous breathing Cardiovascular status: stable Postop Assessment: no apparent nausea or vomiting Anesthetic complications: no   No complications documented.   Last Vitals:  Vitals:   05/31/20 1039  BP: (!) 124/98  Pulse: 93  Resp: 13  Temp: 36.7 C  SpO2: 99%    Last Pain:  Vitals:   05/31/20 1039  TempSrc: Oral  PainSc: 0-No pain                 Everette Rank

## 2020-05-31 NOTE — Transfer of Care (Signed)
Immediate Anesthesia Transfer of Care Note  Patient: Douglas Henry  Procedure(s) Performed: CATARACT EXTRACTION PHACO AND INTRAOCULAR LENS PLACEMENT (IOC) (Left Eye)  Patient Location: Short Stay  Anesthesia Type:MAC  Level of Consciousness: awake, alert , oriented and patient cooperative  Airway & Oxygen Therapy: Patient Spontanous Breathing  Post-op Assessment: Report given to RN and Post -op Vital signs reviewed and stable  Post vital signs: Reviewed and stable  Last Vitals:  Vitals Value Taken Time  BP    Temp    Pulse    Resp    SpO2      Last Pain:  Vitals:   05/31/20 1039  TempSrc: Oral  PainSc: 0-No pain      Patients Stated Pain Goal: 8 (54/00/86 7619)  Complications: No complications documented.

## 2020-05-31 NOTE — Discharge Instructions (Signed)
Please discharge patient when stable, will follow up today with Dr. Clayton Jarmon at the [facility] office immediately following discharge.  Leave shield in place until visit.  All paperwork with discharge instructions will be given at the office.  [facility] Address:  [address]  [place], [place] [address]  

## 2020-05-31 NOTE — Anesthesia Postprocedure Evaluation (Signed)
Anesthesia Post Note  Patient: Douglas Henry  Procedure(s) Performed: CATARACT EXTRACTION PHACO AND INTRAOCULAR LENS PLACEMENT (IOC) (Left Eye)  Patient location during evaluation: Phase II Anesthesia Type: MAC Level of consciousness: awake and alert, oriented and patient cooperative Pain management: pain level controlled Vital Signs Assessment: post-procedure vital signs reviewed and stable Respiratory status: spontaneous breathing Cardiovascular status: stable Postop Assessment: no apparent nausea or vomiting Anesthetic complications: no   No complications documented.   Last Vitals:  Vitals:   05/31/20 1039 05/31/20 1158  BP: (!) 124/98 (!) 138/96  Pulse: 93 88  Resp: 13 16  Temp: 36.7 C 36.8 C  SpO2: 99% 96%    Last Pain:  Vitals:   05/31/20 1158  TempSrc: Oral  PainSc: 0-No pain                 Gayland Curry

## 2020-05-31 NOTE — Op Note (Signed)
Date of procedure: 05/31/20  Pre-operative diagnosis: Visually significant age-related combined cataract, Left Eye (H25.812)  Post-operative diagnosis: Visually significant age-related combined cataract, Left Eye (H25.812)  Procedure: Removal of cataract via phacoemulsification and insertion of intra-ocular lens Alcon DFT015  +19.0D into the capsular bag of the Left Eye  Attending surgeon: Gerda Diss. Jeryl Umholtz, MD, MA  Anesthesia: MAC, Topical Akten  Complications: None  Estimated Blood Loss: <41m (minimal)  Specimens: None  Implants: As above  Indications:  Visually significant age-related cataract, Left Eye  Procedure:  The patient was seen and identified in the pre-operative area. The operative eye was identified and dilated.  The operative eye was marked.  Topical anesthesia was administered to the operative eye.     The patient was then to the operative suite and placed in the supine position.  A timeout was performed confirming the patient, procedure to be performed, and all other relevant information.   The patient's face was prepped and draped in the usual fashion for intra-ocular surgery.  A lid speculum was placed into the operative eye and the surgical microscope moved into place and focused.  An inferotemporal paracentesis was created using a 20 gauge paracentesis blade.  Shugarcaine was injected into the anterior chamber.  Viscoelastic was injected into the anterior chamber.  A temporal clear-corneal main wound incision was created using a 2.425mmicrokeratome.  A continuous curvilinear capsulorrhexis was initiated using an irrigating cystitome and completed using capsulorrhexis forceps.  Hydrodissection and hydrodeliniation were performed.  Viscoelastic was injected into the anterior chamber.  A phacoemulsification handpiece and a chopper as a second instrument were used to remove the nucleus and epinucleus. The irrigation/aspiration handpiece was used to remove any remaining  cortical material.   The capsular bag was reinflated with viscoelastic, checked, and found to be intact.  The intraocular lens was inserted into the capsular bag.  The irrigation/aspiration handpiece was used to remove any remaining viscoelastic.  The clear corneal wound and paracentesis wounds were then hydrated and checked with Weck-Cels to be watertight.  The lid-speculum was removed.  The drape was removed.  The patient's face was cleaned with a wet and dry 4x4.   Maxitrol was instilled in the eye. A clear shield was taped over the eye. The patient was taken to the post-operative care unit in good condition, having tolerated the procedure well.  [facility]-Op Instructions: [facility] and will receive all other orders and instructions.

## 2020-05-31 NOTE — Interval H&P Note (Signed)
History and Physical Interval Note:  05/31/2020 11:24 AM  Douglas Henry  has presented today for surgery, with the diagnosis of Nuclear sclerotic cataract - Left eye.  The various methods of treatment have been discussed with the patient and family. After consideration of risks, benefits and other options for treatment, the patient has consented to  Procedure(s) with comments: CATARACT EXTRACTION PHACO AND INTRAOCULAR LENS PLACEMENT (IOC) (Left) - left as a surgical intervention.  The patient's history has been reviewed, patient examined, no change in status, stable for surgery.  I have reviewed the patient's chart and labs.  Questions were answered to the patient's satisfaction.     [name]

## 2020-06-04 ENCOUNTER — Encounter (HOSPITAL_COMMUNITY): Payer: Self-pay | Admitting: Ophthalmology

## 2020-06-07 NOTE — H&P (Signed)
Surgical History & Physical  Patient Name: Douglas Henry DOB: 12/13/1955  Surgery: Cataract extraction with intraocular lens implant phacoemulsification; Right Eye  Surgeon: [name] MD Surgery Date:  06/14/2020 Pre-Op Date:  06/06/2020  HPI: A 18 Yr. old male patient The patient is returning after cataract surgery (05/31/20). The left eye is affected. Status post cataract surgery, which began 1 weeks ago: Since the last visit, the affected area feels improvement and is doing well. The patient's vision is improved and stable. Patient is following medication instructions. The patient experiences no floaters. The patient complains of nighttime light - car headlights, street lamps etc. glare causing poor vision, which began many years ago. The right eye is affected. The episode is gradual. Pt states this is negatively affecting his quality of life.The complaint is associated with blurry vision and glare HPI was performed by [name] .  Medical History: Cataracts Hyperopia OU  Review of Systems Negative Allergic/Immunologic Negative Cardiovascular Negative Constitutional Negative Ear, Nose, Mouth & Throat Negative Endocrine Negative Eyes Negative Gastrointestinal Negative Genitourinary Negative Hemotologic/Lymphatic Negative Integumentary Negative Musculoskeletal Negative Neurological Negative Psychiatry Negative Respiratory  Social   Current every day smoker  Medication Prednisolone-gatiflox-bromfenac,  Advil, Ibuprofen,   Sx/Procedures Phaco c IOL OS-Multifocal,  Back Surgery,   Drug Allergies  Darvocet-N,   History & Physical: Heent:  Cataract, Right eye NECK: supple without bruits LUNGS: lungs clear to auscultation CV: regular rate and rhythm Abdomen: soft and non-tender  Impression & Plan: Assessment: 1.  CATARACT EXTRACTION STATUS; Left Eye (Z98.42) 2.  COMBINED FORMS AGE RELATED CATARACT; Right Eye (H25.811) 3.  DERMATOCHALASIS; Right Upper Lid,  Left Upper Lid (H02.831, O27.035)  Plan: 1.  1 week after cataract surgery. Doing well with improved vision and normal eye pressure. Call with any problems or concerns. Continue Gati-Brom-Pred 2x/day for 3 more weeks. 2.  Dilates well - shugarcaine by protocol. Consider Vivity Lens - corneal findings not a candidate for diffractive lens. Cataract accounts for the patient's decreased vision. This visual impairment is not correctable with a tolerable change in glasses or contact lenses. Cataract surgery with an implantation of a new lens should significantly improve the visual and functional status of the patient. Discussed all risks, benefits, alternatives, and potential complications. Discussed the procedures and recovery. Patient desires to have surgery. A-scan ordered and performed today for intra-ocular lens calculations. The surgery will be performed in order to improve vision for driving, reading, and for eye examinations. Recommend phacoemulsification with intra-ocular lens. Recommend Dextenza for post-operative pain and inflammation. Right Eye. Surgery required to correct imbalance of vision. Vivity lens. 3.  Asymptomatic, recommend observation for now. Findings, prognosis and treatment options reviewed.

## 2020-06-11 ENCOUNTER — Encounter (HOSPITAL_COMMUNITY)
Admission: RE | Admit: 2020-06-11 | Discharge: 2020-06-11 | Disposition: A | Discharge status: Home / Self Care | Payer: BC Managed Care – PPO | Source: Ambulatory Visit | Attending: Ophthalmology | Admitting: Ophthalmology

## 2020-06-11 ENCOUNTER — Other Ambulatory Visit: Payer: Self-pay

## 2020-06-12 ENCOUNTER — Encounter (HOSPITAL_COMMUNITY): Payer: Self-pay

## 2020-06-12 ENCOUNTER — Other Ambulatory Visit (HOSPITAL_COMMUNITY)
Admission: RE | Admit: 2020-06-12 | Discharge: 2020-06-12 | Disposition: A | Discharge status: Home / Self Care | Payer: BC Managed Care – PPO | Source: Ambulatory Visit | Attending: Ophthalmology | Admitting: Ophthalmology

## 2020-06-12 ENCOUNTER — Other Ambulatory Visit: Payer: Self-pay

## 2020-06-12 DIAGNOSIS — Z885 Allergy status to narcotic agent status: Secondary | ICD-10-CM | POA: Diagnosis not present

## 2020-06-12 DIAGNOSIS — Z20822 Contact with and (suspected) exposure to covid-19: Secondary | ICD-10-CM | POA: Insufficient documentation

## 2020-06-12 DIAGNOSIS — H02834 Dermatochalasis of left upper eyelid: Secondary | ICD-10-CM | POA: Diagnosis not present

## 2020-06-12 DIAGNOSIS — Z79899 Other long term (current) drug therapy: Secondary | ICD-10-CM | POA: Diagnosis not present

## 2020-06-12 DIAGNOSIS — H25811 Combined forms of age-related cataract, right eye: Secondary | ICD-10-CM | POA: Diagnosis present

## 2020-06-12 DIAGNOSIS — H02831 Dermatochalasis of right upper eyelid: Secondary | ICD-10-CM | POA: Diagnosis not present

## 2020-06-12 DIAGNOSIS — Z791 Long term (current) use of non-steroidal anti-inflammatories (NSAID): Secondary | ICD-10-CM | POA: Diagnosis not present

## 2020-06-12 DIAGNOSIS — Z9842 Cataract extraction status, left eye: Secondary | ICD-10-CM | POA: Diagnosis not present

## 2020-06-12 DIAGNOSIS — Z01812 Encounter for preprocedural laboratory examination: Secondary | ICD-10-CM | POA: Insufficient documentation

## 2020-06-12 LAB — SARS CORONAVIRUS 2 (TAT 6-24 HRS): SARS Coronavirus 2: NEGATIVE

## 2020-06-13 IMAGING — US US ABDOMEN LIMITED
1 series · 2 of 2 positions shown · non-contrast
Comparison: 07/08/2018

CLINICAL DATA: Ascites, for paracentesis

EXAM:
LIMITED ABDOMEN ULTRASOUND FOR ASCITES
TECHNIQUE: Limited ultrasound survey for ascites was performed in all four
abdominal quadrants.

[Series 1: us abdomen limited · 2 of 2 slices shown]
[im 1/2]
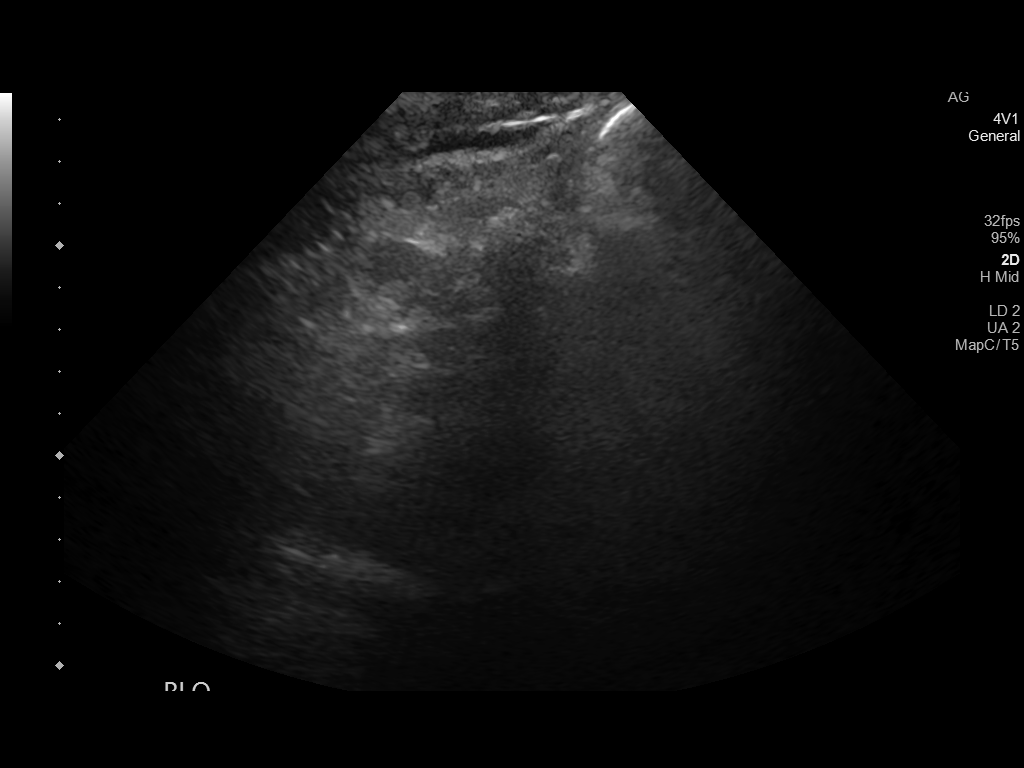
[im 2/2]
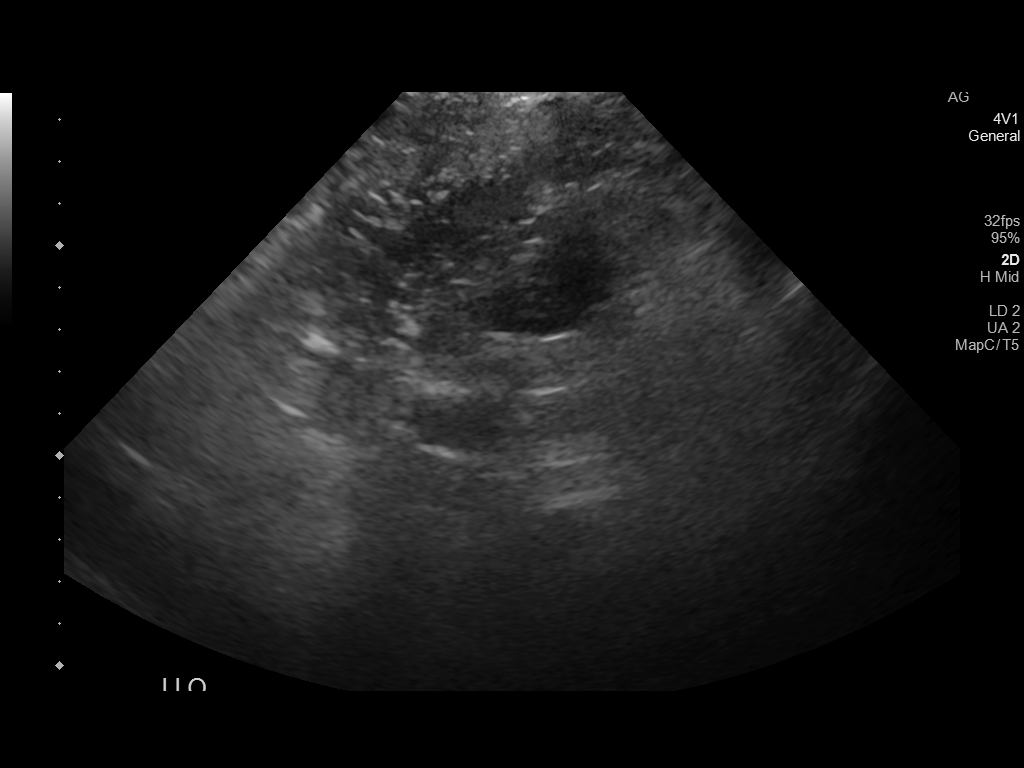

[2 of 2 positions shown; findings below may reference images not displayed]

FINDINGS: Minimal ascites identified upon survey imaging of the abdomen.

Insufficient ascites for paracentesis.
IMPRESSION: Insufficient ascites for paracentesis.

## 2020-06-14 ENCOUNTER — Ambulatory Visit (HOSPITAL_COMMUNITY): Payer: BC Managed Care – PPO | Admitting: Anesthesiology

## 2020-06-14 ENCOUNTER — Encounter (HOSPITAL_COMMUNITY): Payer: Self-pay | Admitting: Ophthalmology

## 2020-06-14 ENCOUNTER — Ambulatory Visit (HOSPITAL_COMMUNITY)
Admission: RE | Admit: 2020-06-14 | Discharge: 2020-06-14 | Disposition: A | Discharge status: Home / Self Care | Payer: BC Managed Care – PPO | Source: Ambulatory Visit | Attending: Ophthalmology | Admitting: Ophthalmology

## 2020-06-14 ENCOUNTER — Other Ambulatory Visit: Payer: Self-pay

## 2020-06-14 ENCOUNTER — Encounter (HOSPITAL_COMMUNITY)
Admission: RE | Disposition: A | Discharge status: Home / Self Care | Payer: Self-pay | Source: Ambulatory Visit | Attending: Ophthalmology

## 2020-06-14 DIAGNOSIS — H25811 Combined forms of age-related cataract, right eye: Secondary | ICD-10-CM | POA: Insufficient documentation

## 2020-06-14 DIAGNOSIS — H02831 Dermatochalasis of right upper eyelid: Secondary | ICD-10-CM | POA: Insufficient documentation

## 2020-06-14 DIAGNOSIS — Z791 Long term (current) use of non-steroidal anti-inflammatories (NSAID): Secondary | ICD-10-CM | POA: Insufficient documentation

## 2020-06-14 DIAGNOSIS — Z20822 Contact with and (suspected) exposure to covid-19: Secondary | ICD-10-CM | POA: Insufficient documentation

## 2020-06-14 DIAGNOSIS — H02834 Dermatochalasis of left upper eyelid: Secondary | ICD-10-CM | POA: Insufficient documentation

## 2020-06-14 DIAGNOSIS — Z885 Allergy status to narcotic agent status: Secondary | ICD-10-CM | POA: Insufficient documentation

## 2020-06-14 DIAGNOSIS — Z79899 Other long term (current) drug therapy: Secondary | ICD-10-CM | POA: Insufficient documentation

## 2020-06-14 DIAGNOSIS — Z9842 Cataract extraction status, left eye: Secondary | ICD-10-CM | POA: Insufficient documentation

## 2020-06-14 HISTORY — PX: CATARACT EXTRACTION W/PHACO: SHX586

## 2020-06-14 SURGERY — PHACOEMULSIFICATION, CATARACT, WITH IOL INSERTION
Anesthesia: Monitor Anesthesia Care | Site: Eye | Laterality: Right

## 2020-06-14 MED ORDER — EPINEPHRINE PF 1 MG/ML IJ SOLN
INTRAMUSCULAR | Status: AC
  Filled 2020-06-14: qty 2

## 2020-06-14 MED ORDER — CYCLOPENTOLATE-PHENYLEPHRINE 0.2-1 % OP SOLN
1.0000 [drp] | OPHTHALMIC | Status: AC | PRN
  Administered 2020-06-14 (×3): 1 [drp] via OPHTHALMIC

## 2020-06-14 MED ORDER — BSS IO SOLN
INTRAOCULAR | Status: DC | PRN
  Administered 2020-06-14: 15 mL via INTRAOCULAR

## 2020-06-14 MED ORDER — EPINEPHRINE PF 1 MG/ML IJ SOLN
INTRAOCULAR | Status: DC | PRN
  Administered 2020-06-14: 10:00:00 500 mL

## 2020-06-14 MED ORDER — LIDOCAINE HCL (PF) 1 % IJ SOLN
INTRAOCULAR | Status: DC | PRN
  Administered 2020-06-14: 10:00:00 1 mL via OPHTHALMIC

## 2020-06-14 MED ORDER — NEOMYCIN-POLYMYXIN-DEXAMETH 3.5-10000-0.1 OP SUSP
OPHTHALMIC | Status: DC | PRN
  Administered 2020-06-14: 1 [drp] via OPHTHALMIC

## 2020-06-14 MED ORDER — POVIDONE-IODINE 5 % OP SOLN
OPHTHALMIC | Status: DC | PRN
  Administered 2020-06-14: 1 via OPHTHALMIC

## 2020-06-14 MED ORDER — PROVISC 10 MG/ML IO SOLN
INTRAOCULAR | Status: DC | PRN
  Administered 2020-06-14: 0.85 mL via INTRAOCULAR

## 2020-06-14 MED ORDER — STERILE WATER FOR IRRIGATION IR SOLN
Status: DC | PRN
  Administered 2020-06-14: 250 mL

## 2020-06-14 MED ORDER — LIDOCAINE HCL 3.5 % OP GEL
1.0000 "application " | Freq: Once | OPHTHALMIC | Status: AC
  Administered 2020-06-14: 1 via OPHTHALMIC

## 2020-06-14 MED ORDER — TETRACAINE HCL 0.5 % OP SOLN
1.0000 [drp] | OPHTHALMIC | Status: AC | PRN
  Administered 2020-06-14 (×3): 1 [drp] via OPHTHALMIC

## 2020-06-14 MED ORDER — LACTATED RINGERS IV SOLN: INTRAVENOUS | Status: DC

## 2020-06-14 MED ORDER — SODIUM HYALURONATE 23 MG/ML IO SOLN
INTRAOCULAR | Status: DC | PRN
  Administered 2020-06-14: 0.6 mL via INTRAOCULAR

## 2020-06-14 MED ORDER — PHENYLEPHRINE HCL 2.5 % OP SOLN
1.0000 [drp] | OPHTHALMIC | Status: AC | PRN
  Administered 2020-06-14 (×3): 1 [drp] via OPHTHALMIC

## 2020-06-14 SURGICAL SUPPLY — 17 items
ACRYSOF IQ VIVITY EXTENDED VISION INTRAOCULAR LENS (Intraocular Lens) ×2 IMPLANT
CLOTH BEACON ORANGE TIMEOUT ST (SAFETY) ×2 IMPLANT
DEVICE MILOOP (MISCELLANEOUS) IMPLANT
EYE SHIELD UNIVERSAL CLEAR (GAUZE/BANDAGES/DRESSINGS) ×2 IMPLANT
GLOVE BIOGEL PI IND STRL 7.0 (GLOVE) IMPLANT
GLOVE BIOGEL PI INDICATOR 7.0 (GLOVE) ×6
MILOOP DEVICE (MISCELLANEOUS)
NDL HYPO 18GX1.5 BLUNT FILL (NEEDLE) IMPLANT
NEEDLE HYPO 18GX1.5 BLUNT FILL (NEEDLE) ×3 IMPLANT
PAD ARMBOARD 7.5X6 YLW CONV (MISCELLANEOUS) ×2 IMPLANT
RING MALYGIN (MISCELLANEOUS) IMPLANT
RING MALYGIN 7.0 (MISCELLANEOUS) IMPLANT
SYR TB 1ML LL NO SAFETY (SYRINGE) ×2 IMPLANT
TAPE SURG TRANSPORE 1 IN (GAUZE/BANDAGES/DRESSINGS) IMPLANT
TAPE SURGICAL TRANSPORE 1 IN (GAUZE/BANDAGES/DRESSINGS) ×3
VISCOELASTIC ADDITIONAL (OPHTHALMIC RELATED) IMPLANT
WATER STERILE IRR 250ML POUR (IV SOLUTION) ×2 IMPLANT

## 2020-06-14 NOTE — Op Note (Signed)
Date of procedure: 06/14/20  Pre-operative diagnosis:  Visually significant combined form age-related cataract, Right Eye (H25.811)  Post-operative diagnosis:  Visually significant combined form age-related cataract, Right Eye (H25.811)  Procedure: Removal of cataract via phacoemulsification and insertion of intra-ocular lens Alcon DFT015  +18.5D into the capsular bag of the Right Eye  Attending surgeon: Gerda Diss. Keelin Neville, MD, MA  Anesthesia: MAC, Topical Akten  Complications: None  Estimated Blood Loss: <15m (minimal)  Specimens: None  Implants: As above  Indications:  Visually significant age-related cataract, Right Eye  Procedure:  The patient was seen and identified in the pre-operative area. The operative eye was identified and dilated.  The operative eye was marked.  Topical anesthesia was administered to the operative eye.     The patient was then to the operative suite and placed in the supine position.  A timeout was performed confirming the patient, procedure to be performed, and all other relevant information.   The patient's face was prepped and draped in the usual fashion for intra-ocular surgery.  A lid speculum was placed into the operative eye and the surgical microscope moved into place and focused.  A superotemporal paracentesis was created using a 20 gauge paracentesis blade.  Shugarcaine was injected into the anterior chamber.  Viscoelastic was injected into the anterior chamber.  A temporal clear-corneal main wound incision was created using a 2.479mmicrokeratome.  A continuous curvilinear capsulorrhexis was initiated using an irrigating cystitome and completed using capsulorrhexis forceps.  Hydrodissection and hydrodeliniation were performed.  Viscoelastic was injected into the anterior chamber.  A phacoemulsification handpiece and a chopper as a second instrument were used to remove the nucleus and epinucleus. The irrigation/aspiration handpiece was used to remove any  remaining cortical material.   The capsular bag was reinflated with viscoelastic, checked, and found to be intact.  The intraocular lens was inserted into the capsular bag.  The irrigation/aspiration handpiece was used to remove any remaining viscoelastic.  The clear corneal wound and paracentesis wounds were then hydrated and checked with Weck-Cels to be watertight.  The lid-speculum was removed.  The drape was removed.  The patient's face was cleaned with a wet and dry 4x4.   Maxitrol was instilled in the eye. A clear shield was taped over the eye. The patient was taken to the post-operative care unit in good condition, having tolerated the procedure well.  [facility]Op Instructions: [facility] and will receive all other orders and instructions.

## 2020-06-14 NOTE — Transfer of Care (Signed)
Immediate Anesthesia Transfer of Care Note  Patient: Douglas Henry  Procedure(s) Performed: CATARACT EXTRACTION PHACO AND INTRAOCULAR LENS PLACEMENT RIGHT EYE (Right Eye)  Patient Location: Short Stay  Anesthesia Type:MAC  Level of Consciousness: awake, alert  and patient cooperative  Airway & Oxygen Therapy: Patient Spontanous Breathing  Post-op Assessment: Report given to RN and Post -op Vital signs reviewed and stable  Post vital signs: Reviewed and stable  Last Vitals:  Vitals Value Taken Time  BP    Temp    Pulse    Resp    SpO2    SEE PACU FLOW SHEET  Last Pain:  Vitals:   06/14/20 0903  TempSrc: Oral  PainSc: 0-No pain      Patients Stated Pain Goal: 7 (20/03/79 4446)  Complications: No complications documented.

## 2020-06-14 NOTE — Interval H&P Note (Signed)
History and Physical Interval Note:  06/14/2020 9:29 AM  Douglas Henry  has presented today for surgery, with the diagnosis of Nuclear sclerotic cataract - Right eye.  The various methods of treatment have been discussed with the patient and family. After consideration of risks, benefits and other options for treatment, the patient has consented to  Procedure(s) with comments: CATARACT EXTRACTION PHACO AND INTRAOCULAR LENS PLACEMENT (IOC) (Right) - right as a surgical intervention.  The patient's history has been reviewed, patient examined, no change in status, stable for surgery.  I have reviewed the patient's chart and labs.  Questions were answered to the patient's satisfaction.     [name]

## 2020-06-14 NOTE — Anesthesia Postprocedure Evaluation (Signed)
Anesthesia Post Note  Patient: Douglas Henry  Procedure(s) Performed: CATARACT EXTRACTION PHACO AND INTRAOCULAR LENS PLACEMENT RIGHT EYE (Right Eye)  Patient location during evaluation: Short Stay Anesthesia Type: MAC Level of consciousness: awake and alert and patient cooperative Pain management: satisfactory to patient Vital Signs Assessment: post-procedure vital signs reviewed and stable Respiratory status: spontaneous breathing Cardiovascular status: stable Postop Assessment: no apparent nausea or vomiting Anesthetic complications: no   No complications documented.   Last Vitals:  Vitals:   06/14/20 0903 06/14/20 0959  BP: (!) 137/91 (!) 134/92  Pulse: 94 95  Resp: 20 16  Temp: 36.8 C 36.8 C  SpO2: 100% 98%    Last Pain:  Vitals:   06/14/20 0959  TempSrc: Oral  PainSc: 0-No pain                 Lamin Chandley

## 2020-06-14 NOTE — Discharge Instructions (Addendum)
Please discharge patient when stable, will follow up today with Dr. Wrzosek at the [facility] [place] office immediately following discharge.  Leave shield in place until visit.  All paperwork with discharge instructions will be given at the office.  [facility] Address:  [address]  [place], [place] [address]  

## 2020-06-14 NOTE — Anesthesia Procedure Notes (Signed)
Procedure Name: MAC Date/Time: 06/14/2020 9:34 AM Performed by: Vista Deck, CRNA Pre-anesthesia Checklist: Patient identified, Emergency Drugs available, Suction available, Timeout performed and Patient being monitored Patient Re-evaluated:Patient Re-evaluated prior to induction Oxygen Delivery Method: Nasal Cannula

## 2020-06-14 NOTE — Anesthesia Preprocedure Evaluation (Signed)
Anesthesia Evaluation  Patient identified by MRN, date of birth, ID band Patient awake    Reviewed: Allergy & Precautions, H&P , NPO status , Patient's Chart, lab work & pertinent test results, reviewed documented beta blocker date and time   Airway Mallampati: II  TM Distance: >3 FB Neck ROM: full    Dental no notable dental hx. (+) Teeth Intact   Pulmonary neg pulmonary ROS, Current Smoker,    Pulmonary exam normal breath sounds clear to auscultation       Cardiovascular Exercise Tolerance: Good negative cardio ROS   Rhythm:regular Rate:Normal     Neuro/Psych negative neurological ROS  negative psych ROS   GI/Hepatic negative GI ROS, Neg liver ROS,   Endo/Other  negative endocrine ROS  Renal/GU negative Renal ROS  negative genitourinary   Musculoskeletal   Abdominal   Peds  Hematology negative hematology ROS (+)   Anesthesia Other Findings   Reproductive/Obstetrics negative OB ROS                             Anesthesia Physical  Anesthesia Plan  ASA: III  Anesthesia Plan: MAC   Post-op Pain Management:    Induction:   PONV Risk Score and Plan:   Airway Management Planned:   Additional Equipment:   Intra-op Plan:   Post-operative Plan:   Informed Consent: I have reviewed the patients History and Physical, chart, labs and discussed the procedure including the risks, benefits and alternatives for the proposed anesthesia with the patient or authorized representative who has indicated his/her understanding and acceptance.     Dental Advisory Given  Plan Discussed with: CRNA  Anesthesia Plan Comments:         Anesthesia Quick Evaluation

## 2020-06-17 ENCOUNTER — Encounter (HOSPITAL_COMMUNITY): Payer: Self-pay | Admitting: Ophthalmology

## 2021-03-10 DIAGNOSIS — M2569 Stiffness of other specified joint, not elsewhere classified: Secondary | ICD-10-CM | POA: Diagnosis not present

## 2021-03-10 DIAGNOSIS — M6281 Muscle weakness (generalized): Secondary | ICD-10-CM | POA: Diagnosis not present

## 2021-03-10 DIAGNOSIS — M79604 Pain in right leg: Secondary | ICD-10-CM | POA: Diagnosis not present

## 2021-03-10 DIAGNOSIS — M79605 Pain in left leg: Secondary | ICD-10-CM | POA: Diagnosis not present

## 2021-03-10 DIAGNOSIS — M799 Soft tissue disorder, unspecified: Secondary | ICD-10-CM | POA: Diagnosis not present

## 2021-03-25 DIAGNOSIS — M79604 Pain in right leg: Secondary | ICD-10-CM | POA: Diagnosis not present

## 2021-03-25 DIAGNOSIS — M79605 Pain in left leg: Secondary | ICD-10-CM | POA: Diagnosis not present

## 2021-03-25 DIAGNOSIS — M6281 Muscle weakness (generalized): Secondary | ICD-10-CM | POA: Diagnosis not present

## 2021-03-25 DIAGNOSIS — M2569 Stiffness of other specified joint, not elsewhere classified: Secondary | ICD-10-CM | POA: Diagnosis not present

## 2021-03-25 DIAGNOSIS — M799 Soft tissue disorder, unspecified: Secondary | ICD-10-CM | POA: Diagnosis not present

## 2021-03-28 DIAGNOSIS — M2569 Stiffness of other specified joint, not elsewhere classified: Secondary | ICD-10-CM | POA: Diagnosis not present

## 2021-03-28 DIAGNOSIS — M79604 Pain in right leg: Secondary | ICD-10-CM | POA: Diagnosis not present

## 2021-03-28 DIAGNOSIS — M799 Soft tissue disorder, unspecified: Secondary | ICD-10-CM | POA: Diagnosis not present

## 2021-03-28 DIAGNOSIS — M79605 Pain in left leg: Secondary | ICD-10-CM | POA: Diagnosis not present

## 2021-03-28 DIAGNOSIS — M6281 Muscle weakness (generalized): Secondary | ICD-10-CM | POA: Diagnosis not present

## 2021-03-31 DIAGNOSIS — M2569 Stiffness of other specified joint, not elsewhere classified: Secondary | ICD-10-CM | POA: Diagnosis not present

## 2021-03-31 DIAGNOSIS — M6281 Muscle weakness (generalized): Secondary | ICD-10-CM | POA: Diagnosis not present

## 2021-03-31 DIAGNOSIS — M799 Soft tissue disorder, unspecified: Secondary | ICD-10-CM | POA: Diagnosis not present

## 2021-03-31 DIAGNOSIS — M79604 Pain in right leg: Secondary | ICD-10-CM | POA: Diagnosis not present

## 2021-03-31 DIAGNOSIS — M79605 Pain in left leg: Secondary | ICD-10-CM | POA: Diagnosis not present

## 2021-04-02 DIAGNOSIS — M2569 Stiffness of other specified joint, not elsewhere classified: Secondary | ICD-10-CM | POA: Diagnosis not present

## 2021-04-02 DIAGNOSIS — M79605 Pain in left leg: Secondary | ICD-10-CM | POA: Diagnosis not present

## 2021-04-02 DIAGNOSIS — M799 Soft tissue disorder, unspecified: Secondary | ICD-10-CM | POA: Diagnosis not present

## 2021-04-02 DIAGNOSIS — M6281 Muscle weakness (generalized): Secondary | ICD-10-CM | POA: Diagnosis not present

## 2021-04-02 DIAGNOSIS — M79604 Pain in right leg: Secondary | ICD-10-CM | POA: Diagnosis not present

## 2021-04-07 DIAGNOSIS — M6281 Muscle weakness (generalized): Secondary | ICD-10-CM | POA: Diagnosis not present

## 2021-04-07 DIAGNOSIS — M799 Soft tissue disorder, unspecified: Secondary | ICD-10-CM | POA: Diagnosis not present

## 2021-04-07 DIAGNOSIS — M2569 Stiffness of other specified joint, not elsewhere classified: Secondary | ICD-10-CM | POA: Diagnosis not present

## 2021-04-07 DIAGNOSIS — M79605 Pain in left leg: Secondary | ICD-10-CM | POA: Diagnosis not present

## 2021-04-07 DIAGNOSIS — M79604 Pain in right leg: Secondary | ICD-10-CM | POA: Diagnosis not present

## 2021-04-10 DIAGNOSIS — M79604 Pain in right leg: Secondary | ICD-10-CM | POA: Diagnosis not present

## 2021-04-10 DIAGNOSIS — M79605 Pain in left leg: Secondary | ICD-10-CM | POA: Diagnosis not present

## 2021-04-10 DIAGNOSIS — M2569 Stiffness of other specified joint, not elsewhere classified: Secondary | ICD-10-CM | POA: Diagnosis not present

## 2021-04-10 DIAGNOSIS — M6281 Muscle weakness (generalized): Secondary | ICD-10-CM | POA: Diagnosis not present

## 2021-04-10 DIAGNOSIS — M799 Soft tissue disorder, unspecified: Secondary | ICD-10-CM | POA: Diagnosis not present

## 2021-04-14 DIAGNOSIS — M4316 Spondylolisthesis, lumbar region: Secondary | ICD-10-CM | POA: Diagnosis not present

## 2021-04-17 DIAGNOSIS — M2569 Stiffness of other specified joint, not elsewhere classified: Secondary | ICD-10-CM | POA: Diagnosis not present

## 2021-04-17 DIAGNOSIS — M799 Soft tissue disorder, unspecified: Secondary | ICD-10-CM | POA: Diagnosis not present

## 2021-04-17 DIAGNOSIS — M6281 Muscle weakness (generalized): Secondary | ICD-10-CM | POA: Diagnosis not present

## 2021-04-17 DIAGNOSIS — M79604 Pain in right leg: Secondary | ICD-10-CM | POA: Diagnosis not present

## 2021-04-17 DIAGNOSIS — M79605 Pain in left leg: Secondary | ICD-10-CM | POA: Diagnosis not present

## 2021-04-23 DIAGNOSIS — M799 Soft tissue disorder, unspecified: Secondary | ICD-10-CM | POA: Diagnosis not present

## 2021-04-23 DIAGNOSIS — M79604 Pain in right leg: Secondary | ICD-10-CM | POA: Diagnosis not present

## 2021-04-23 DIAGNOSIS — M2569 Stiffness of other specified joint, not elsewhere classified: Secondary | ICD-10-CM | POA: Diagnosis not present

## 2021-04-23 DIAGNOSIS — M79605 Pain in left leg: Secondary | ICD-10-CM | POA: Diagnosis not present

## 2021-04-23 DIAGNOSIS — M6281 Muscle weakness (generalized): Secondary | ICD-10-CM | POA: Diagnosis not present

## 2021-04-29 DIAGNOSIS — M79605 Pain in left leg: Secondary | ICD-10-CM | POA: Diagnosis not present

## 2021-04-29 DIAGNOSIS — M2569 Stiffness of other specified joint, not elsewhere classified: Secondary | ICD-10-CM | POA: Diagnosis not present

## 2021-04-29 DIAGNOSIS — M79604 Pain in right leg: Secondary | ICD-10-CM | POA: Diagnosis not present

## 2021-04-29 DIAGNOSIS — M6281 Muscle weakness (generalized): Secondary | ICD-10-CM | POA: Diagnosis not present

## 2021-04-29 DIAGNOSIS — M799 Soft tissue disorder, unspecified: Secondary | ICD-10-CM | POA: Diagnosis not present

## 2021-04-30 DIAGNOSIS — M79605 Pain in left leg: Secondary | ICD-10-CM | POA: Diagnosis not present

## 2021-04-30 DIAGNOSIS — M6281 Muscle weakness (generalized): Secondary | ICD-10-CM | POA: Diagnosis not present

## 2021-04-30 DIAGNOSIS — M79604 Pain in right leg: Secondary | ICD-10-CM | POA: Diagnosis not present

## 2021-04-30 DIAGNOSIS — M2569 Stiffness of other specified joint, not elsewhere classified: Secondary | ICD-10-CM | POA: Diagnosis not present

## 2021-04-30 DIAGNOSIS — M799 Soft tissue disorder, unspecified: Secondary | ICD-10-CM | POA: Diagnosis not present

## 2021-05-06 DIAGNOSIS — M6281 Muscle weakness (generalized): Secondary | ICD-10-CM | POA: Diagnosis not present

## 2021-05-06 DIAGNOSIS — M2569 Stiffness of other specified joint, not elsewhere classified: Secondary | ICD-10-CM | POA: Diagnosis not present

## 2021-05-06 DIAGNOSIS — M79604 Pain in right leg: Secondary | ICD-10-CM | POA: Diagnosis not present

## 2021-05-06 DIAGNOSIS — M79605 Pain in left leg: Secondary | ICD-10-CM | POA: Diagnosis not present

## 2021-05-06 DIAGNOSIS — M799 Soft tissue disorder, unspecified: Secondary | ICD-10-CM | POA: Diagnosis not present

## 2021-05-07 DIAGNOSIS — M79604 Pain in right leg: Secondary | ICD-10-CM | POA: Diagnosis not present

## 2021-05-07 DIAGNOSIS — M2569 Stiffness of other specified joint, not elsewhere classified: Secondary | ICD-10-CM | POA: Diagnosis not present

## 2021-05-07 DIAGNOSIS — M79605 Pain in left leg: Secondary | ICD-10-CM | POA: Diagnosis not present

## 2021-05-07 DIAGNOSIS — M799 Soft tissue disorder, unspecified: Secondary | ICD-10-CM | POA: Diagnosis not present

## 2021-05-07 DIAGNOSIS — M6281 Muscle weakness (generalized): Secondary | ICD-10-CM | POA: Diagnosis not present

## 2021-05-12 DIAGNOSIS — R030 Elevated blood-pressure reading, without diagnosis of hypertension: Secondary | ICD-10-CM | POA: Diagnosis not present

## 2021-05-12 DIAGNOSIS — M4316 Spondylolisthesis, lumbar region: Secondary | ICD-10-CM | POA: Diagnosis not present

## 2021-05-12 DIAGNOSIS — M5416 Radiculopathy, lumbar region: Secondary | ICD-10-CM | POA: Diagnosis not present

## 2021-09-15 DIAGNOSIS — M545 Low back pain, unspecified: Secondary | ICD-10-CM | POA: Diagnosis not present

## 2021-09-15 DIAGNOSIS — Z299 Encounter for prophylactic measures, unspecified: Secondary | ICD-10-CM | POA: Diagnosis not present

## 2021-09-15 DIAGNOSIS — F17210 Nicotine dependence, cigarettes, uncomplicated: Secondary | ICD-10-CM | POA: Diagnosis not present

## 2021-09-16 ENCOUNTER — Other Ambulatory Visit: Payer: Self-pay | Admitting: Neurosurgery

## 2021-09-23 ENCOUNTER — Other Ambulatory Visit: Payer: Self-pay | Admitting: Neurosurgery

## 2021-10-08 NOTE — Progress Notes (Signed)
Surgical Instructions ? ? ? Your procedure is scheduled on Thursday April 20th. ? Report to [facility] Main Entrance "A" at 10:45 A.M., then check in with the Admitting office. ? Call this number if you have problems the morning of surgery: ? [phone] ? ? If you have any questions prior to your surgery date call [phone]: Open Monday-Friday 8am-4pm ? ? ? Remember: ? Do not eat or drink after midnight the night before your surgery ?  ? Take these medicines the morning of surgery with A SIP OF WATER: ?IF NEEDED Polyethyl Glycol-Propyl Glycol (LUBRICATING EYE DROPS) 0.4-0.3 % SOLN ? ? ?As of today, STOP taking any Aspirin (unless otherwise instructed by your surgeon) Aleve, Naproxen, Ibuprofen, Motrin, Advil, Goody's, BC's, all herbal medications, fish oil, and all vitamins. ? ?         ?Do not wear jewelry or makeup ?Do not wear lotions, powders, colognes, or deodorant. ?Do not shave 48 hours prior to surgery.  Men may shave face and neck. ?Do not bring valuables to the hospital. ?Do not wear nail polish, gel polish, artificial nails, or any other type of covering on natural nails (fingers and toes) ? ? ?[facility] is not responsible for any belongings or valuables. .  ? ?Do NOT Smoke (Tobacco/Vaping)  24 hours prior to your procedure ? ?If you use a CPAP at night, you may bring your mask for your overnight stay. ?  ?Contacts, glasses, hearing aids, dentures or partials may not be worn into surgery, please bring cases for these belongings ?  ?For patients admitted to the hospital, discharge time will be determined by your treatment team. ?  ?Patients discharged the day of surgery will not be allowed to drive home, and someone needs to stay with them for 24 hours. ? ? ?SURGICAL WAITING ROOM VISITATION ?Patients having surgery or a procedure in a hospital may have two support people. ?Children under the age of 65 must have an adult with them who is not the patient. ?They may stay in the waiting area during the  procedure and may switch out with other visitors. If the patient needs to stay at the hospital during part of their recovery, the visitor guidelines for inpatient rooms apply. ? ?Please refer to the [facility] website for the visitor guidelines for Inpatients (after your surgery is over and you are in a regular room).  ? ? ? ? ? ?Special instructions:   ? ?Oral Hygiene is also important to reduce your risk of infection.  Remember - BRUSH YOUR TEETH THE MORNING OF SURGERY WITH YOUR REGULAR TOOTHPASTE ? ? ?[facility]- Preparing For Surgery ? ?Before surgery, you can play an important role. Because skin is not sterile, your skin needs to be as free of germs as possible. You can reduce the number of germs on your skin by washing with CHG (chlorahexidine gluconate) Soap before surgery.  CHG is an antiseptic cleaner which kills germs and bonds with the skin to continue killing germs even after washing.   ? ? ?Please do not use if you have an allergy to CHG or antibacterial soaps. If your skin becomes reddened/irritated stop using the CHG.  ?Do not shave (including legs and underarms) for at least 48 hours prior to first CHG shower. It is OK to shave your face. ? ?Please follow these instructions carefully. ?  ? ? Shower the NIGHT BEFORE SURGERY and the MORNING OF SURGERY with CHG Soap.  ? If you chose to wash your  hair, wash your hair first as usual with your normal shampoo. After you shampoo, rinse your hair and body thoroughly to remove the shampoo.  Then [employer] and genitals (private parts) with your normal soap and rinse thoroughly to remove soap. ? ?After that Use CHG Soap as you would any other liquid soap. You can apply CHG directly to the skin and wash gently with a scrungie or a clean washcloth.  ? ?Apply the CHG Soap to your body ONLY FROM THE NECK DOWN.  Do not use on open wounds or open sores. Avoid contact with your eyes, ears, mouth and genitals (private parts). Wash Face and genitals (private parts)   with your normal soap.  ? ?Wash thoroughly, paying special attention to the area where your surgery will be performed. ? ?Thoroughly rinse your body with warm water from the neck down. ? ?DO NOT shower/wash with your normal soap after using and rinsing off the CHG Soap. ? ?Pat yourself dry with a CLEAN TOWEL. ? ?Wear CLEAN PAJAMAS to bed the night before surgery ? ?Place CLEAN SHEETS on your bed the night before your surgery ? ?DO NOT SLEEP WITH PETS. ? ? ?Day of Surgery: ? ?Take a shower with CHG soap. ?Wear Clean/Comfortable clothing the morning of surgery ?Do not apply any deodorants/lotions.   ?Remember to brush your teeth WITH YOUR REGULAR TOOTHPASTE. ? ? ? ?If you received a COVID test during your pre-op visit  it is requested that you wear a mask when out in public, stay away from anyone that may not be feeling well and notify your surgeon if you develop symptoms. If you have been in contact with anyone that has tested positive in the last 10 days please notify you surgeon. ? ?  ?Please read over the following fact sheets that you were given.  ? ?

## 2021-10-09 ENCOUNTER — Encounter (HOSPITAL_COMMUNITY): Payer: Self-pay

## 2021-10-09 ENCOUNTER — Encounter (HOSPITAL_COMMUNITY)
Admission: RE | Admit: 2021-10-09 | Discharge: 2021-10-09 | Disposition: A | Discharge status: Home / Self Care | Payer: Medicare Other | Source: Ambulatory Visit | Attending: Neurosurgery | Admitting: Neurosurgery

## 2021-10-09 ENCOUNTER — Other Ambulatory Visit: Payer: Self-pay

## 2021-10-09 VITALS — BP 125/92 | HR 83 | Temp 97.6°F | Resp 17 | Ht 72.0 in | Wt 171.0 lb

## 2021-10-09 DIAGNOSIS — Z01812 Encounter for preprocedural laboratory examination: Secondary | ICD-10-CM | POA: Insufficient documentation

## 2021-10-09 DIAGNOSIS — Z01818 Encounter for other preprocedural examination: Secondary | ICD-10-CM

## 2021-10-09 LAB — CBC
HCT: 48.6 % (ref 39.0–52.0)
Hemoglobin: 16.6 g/dL (ref 13.0–17.0)
MCH: 32.2 pg (ref 26.0–34.0)
MCHC: 34.2 g/dL (ref 30.0–36.0)
MCV: 94.4 fL (ref 80.0–100.0)
Platelets: 167 10*3/uL (ref 150–400)
RBC: 5.15 MIL/uL (ref 4.22–5.81)
RDW: 12.3 % (ref 11.5–15.5)
WBC: 8.8 10*3/uL (ref 4.0–10.5)
nRBC: 0 % (ref 0.0–0.2)

## 2021-10-09 LAB — SURGICAL PCR SCREEN
MRSA, PCR: NEGATIVE
Staphylococcus aureus: NEGATIVE

## 2021-10-09 LAB — TYPE AND SCREEN
ABO/RH(D): B NEG
Antibody Screen: NEGATIVE

## 2021-10-09 NOTE — Progress Notes (Signed)
PCP - Dr. Kirstie Peri ?Cardiologist - denies ? ?PPM/ICD - n/a ? ?Chest x-ray - n/a ?EKG - n/a ?Stress Test - denies  ?ECHO - denies ?Cardiac Cath - denies ? ?Sleep Study - denies ?CPAP - denies ? ?Blood Thinner Instructions: n/a ?Aspirin Instructions: n/a ? ?NPO at midnight ? ?COVID TEST- n/a ? ? ?Anesthesia review: No ? ?Patient denies shortness of breath, fever, cough and chest pain at PAT appointment ? ? ?All instructions explained to the patient, with a verbal understanding of the material. Patient agrees to go over the instructions while at home for a better understanding. Patient also instructed to self quarantine after being tested for COVID-19. The opportunity to ask questions was provided. ? ? ?

## 2021-10-16 ENCOUNTER — Ambulatory Visit (HOSPITAL_COMMUNITY): Payer: Medicare Other | Admitting: Certified Registered"

## 2021-10-16 ENCOUNTER — Encounter (HOSPITAL_COMMUNITY): Payer: Self-pay | Admitting: Neurosurgery

## 2021-10-16 ENCOUNTER — Ambulatory Visit (HOSPITAL_BASED_OUTPATIENT_CLINIC_OR_DEPARTMENT_OTHER): Payer: Medicare Other | Admitting: Certified Registered"

## 2021-10-16 ENCOUNTER — Other Ambulatory Visit: Payer: Self-pay

## 2021-10-16 ENCOUNTER — Encounter (HOSPITAL_COMMUNITY)
Admission: RE | Disposition: A | Discharge status: Home / Self Care | Payer: Self-pay | Source: Home / Self Care | Attending: Neurosurgery

## 2021-10-16 ENCOUNTER — Observation Stay (HOSPITAL_COMMUNITY)
Admission: RE | Admit: 2021-10-16 | Discharge: 2021-10-17 | Disposition: A | Discharge status: Home / Self Care | Payer: Medicare Other | Attending: Neurosurgery | Admitting: Neurosurgery

## 2021-10-16 ENCOUNTER — Ambulatory Visit (HOSPITAL_COMMUNITY): Payer: Medicare Other

## 2021-10-16 DIAGNOSIS — M4316 Spondylolisthesis, lumbar region: Secondary | ICD-10-CM

## 2021-10-16 DIAGNOSIS — M47816 Spondylosis without myelopathy or radiculopathy, lumbar region: Secondary | ICD-10-CM | POA: Insufficient documentation

## 2021-10-16 DIAGNOSIS — M48062 Spinal stenosis, lumbar region with neurogenic claudication: Principal | ICD-10-CM | POA: Insufficient documentation

## 2021-10-16 DIAGNOSIS — Z96651 Presence of right artificial knee joint: Secondary | ICD-10-CM | POA: Diagnosis not present

## 2021-10-16 DIAGNOSIS — F17210 Nicotine dependence, cigarettes, uncomplicated: Secondary | ICD-10-CM | POA: Diagnosis not present

## 2021-10-16 DIAGNOSIS — Z79899 Other long term (current) drug therapy: Secondary | ICD-10-CM | POA: Diagnosis not present

## 2021-10-16 DIAGNOSIS — M4326 Fusion of spine, lumbar region: Secondary | ICD-10-CM | POA: Diagnosis not present

## 2021-10-16 DIAGNOSIS — Z981 Arthrodesis status: Secondary | ICD-10-CM | POA: Diagnosis not present

## 2021-10-16 DIAGNOSIS — M4722 Other spondylosis with radiculopathy, cervical region: Secondary | ICD-10-CM | POA: Diagnosis not present

## 2021-10-16 LAB — ABO/RH: ABO/RH(D): B NEG

## 2021-10-16 SURGERY — POSTERIOR LUMBAR FUSION 1 LEVEL
Anesthesia: General | Site: Spine Lumbar

## 2021-10-16 MED ORDER — METHOCARBAMOL 1000 MG/10ML IJ SOLN
500.0000 mg | Freq: Four times a day (QID) | INTRAVENOUS | Status: DC | PRN
  Filled 2021-10-16: qty 5

## 2021-10-16 MED ORDER — DEXAMETHASONE SODIUM PHOSPHATE 10 MG/ML IJ SOLN
INTRAMUSCULAR | Status: AC
  Filled 2021-10-16: qty 1

## 2021-10-16 MED ORDER — SODIUM CHLORIDE 0.9% FLUSH: 3.0000 mL | Freq: Two times a day (BID) | INTRAVENOUS | Status: DC

## 2021-10-16 MED ORDER — ACETAMINOPHEN 325 MG PO TABS
650.0000 mg | ORAL_TABLET | ORAL | Status: DC | PRN
  Administered 2021-10-16 – 2021-10-17 (×2): 650 mg via ORAL
  Filled 2021-10-16 (×2): qty 2

## 2021-10-16 MED ORDER — FENTANYL CITRATE (PF) 100 MCG/2ML IJ SOLN
25.0000 ug | INTRAMUSCULAR | Status: DC | PRN
  Administered 2021-10-16: 50 ug via INTRAVENOUS

## 2021-10-16 MED ORDER — FENTANYL CITRATE (PF) 100 MCG/2ML IJ SOLN
INTRAMUSCULAR | Status: DC | PRN
  Administered 2021-10-16: 50 ug via INTRAVENOUS
  Administered 2021-10-16: 100 ug via INTRAVENOUS
  Administered 2021-10-16 (×2): 50 ug via INTRAVENOUS

## 2021-10-16 MED ORDER — OXYCODONE HCL 5 MG PO TABS: 5.0000 mg | ORAL_TABLET | Freq: Once | ORAL | Status: DC | PRN

## 2021-10-16 MED ORDER — ONDANSETRON HCL 4 MG/2ML IJ SOLN: 4.0000 mg | Freq: Four times a day (QID) | INTRAMUSCULAR | Status: DC | PRN

## 2021-10-16 MED ORDER — CEFAZOLIN SODIUM-DEXTROSE 2-4 GM/100ML-% IV SOLN
2.0000 g | INTRAVENOUS | Status: AC
  Administered 2021-10-16: 2 g via INTRAVENOUS
  Filled 2021-10-16: qty 100

## 2021-10-16 MED ORDER — SENNA 8.6 MG PO TABS
1.0000 | ORAL_TABLET | Freq: Two times a day (BID) | ORAL | Status: DC
  Administered 2021-10-16 – 2021-10-17 (×2): 8.6 mg via ORAL
  Filled 2021-10-16 (×2): qty 1

## 2021-10-16 MED ORDER — SODIUM CHLORIDE 0.9 % IV SOLN: INTRAVENOUS | Status: DC

## 2021-10-16 MED ORDER — ACETAMINOPHEN 500 MG PO TABS
1000.0000 mg | ORAL_TABLET | Freq: Once | ORAL | Status: AC
  Administered 2021-10-16: 1000 mg via ORAL
  Filled 2021-10-16: qty 2

## 2021-10-16 MED ORDER — CHLORHEXIDINE GLUCONATE CLOTH 2 % EX PADS: 6.0000 | MEDICATED_PAD | Freq: Once | CUTANEOUS | Status: DC

## 2021-10-16 MED ORDER — PROPOFOL 10 MG/ML IV BOLUS
INTRAVENOUS | Status: DC | PRN
  Administered 2021-10-16: 160 mg via INTRAVENOUS
  Administered 2021-10-16: 40 mg via INTRAVENOUS

## 2021-10-16 MED ORDER — PHENYLEPHRINE 80 MCG/ML (10ML) SYRINGE FOR IV PUSH (FOR BLOOD PRESSURE SUPPORT)
PREFILLED_SYRINGE | INTRAVENOUS | Status: DC | PRN
  Administered 2021-10-16 (×3): 80 ug via INTRAVENOUS

## 2021-10-16 MED ORDER — ONDANSETRON HCL 4 MG/2ML IJ SOLN
INTRAMUSCULAR | Status: DC | PRN
  Administered 2021-10-16: 4 mg via INTRAVENOUS

## 2021-10-16 MED ORDER — PROPOFOL 10 MG/ML IV BOLUS
INTRAVENOUS | Status: AC
  Filled 2021-10-16: qty 20

## 2021-10-16 MED ORDER — FENTANYL CITRATE (PF) 250 MCG/5ML IJ SOLN
INTRAMUSCULAR | Status: AC
  Filled 2021-10-16: qty 5

## 2021-10-16 MED ORDER — THROMBIN 5000 UNITS EX SOLR: OROMUCOSAL | Status: DC | PRN

## 2021-10-16 MED ORDER — SUGAMMADEX SODIUM 200 MG/2ML IV SOLN
INTRAVENOUS | Status: DC | PRN
Start: 2021-10-16 — End: 2021-10-16
  Administered 2021-10-16: 200 mg via INTRAVENOUS

## 2021-10-16 MED ORDER — ORAL CARE MOUTH RINSE: 15.0000 mL | Freq: Once | OROMUCOSAL | Status: AC

## 2021-10-16 MED ORDER — ONDANSETRON HCL 4 MG PO TABS: 4.0000 mg | ORAL_TABLET | Freq: Four times a day (QID) | ORAL | Status: DC | PRN

## 2021-10-16 MED ORDER — OXYCODONE HCL 5 MG/5ML PO SOLN: 5.0000 mg | Freq: Once | ORAL | Status: DC | PRN

## 2021-10-16 MED ORDER — LIDOCAINE 2% (20 MG/ML) 5 ML SYRINGE
INTRAMUSCULAR | Status: DC | PRN
  Administered 2021-10-16: 60 mg via INTRAVENOUS

## 2021-10-16 MED ORDER — METHOCARBAMOL 500 MG PO TABS
500.0000 mg | ORAL_TABLET | Freq: Four times a day (QID) | ORAL | Status: DC | PRN
  Administered 2021-10-16 – 2021-10-17 (×3): 500 mg via ORAL
  Filled 2021-10-16 (×3): qty 1

## 2021-10-16 MED ORDER — ACETAMINOPHEN 650 MG RE SUPP
650.0000 mg | RECTAL | Status: DC | PRN
Start: 2021-10-16 — End: 2021-10-17

## 2021-10-16 MED ORDER — BUPIVACAINE HCL (PF) 0.5 % IJ SOLN
INTRAMUSCULAR | Status: AC
  Filled 2021-10-16: qty 30

## 2021-10-16 MED ORDER — LIDOCAINE-EPINEPHRINE 1 %-1:100000 IJ SOLN
INTRAMUSCULAR | Status: AC
  Filled 2021-10-16: qty 1

## 2021-10-16 MED ORDER — MIDAZOLAM HCL 2 MG/2ML IJ SOLN
INTRAMUSCULAR | Status: DC | PRN
Start: 2021-10-16 — End: 2021-10-16
  Administered 2021-10-16: 2 mg via INTRAVENOUS

## 2021-10-16 MED ORDER — THROMBIN 5000 UNITS EX SOLR
CUTANEOUS | Status: AC
  Filled 2021-10-16: qty 5000

## 2021-10-16 MED ORDER — MORPHINE SULFATE (PF) 2 MG/ML IV SOLN
2.0000 mg | INTRAVENOUS | Status: DC | PRN
  Administered 2021-10-17: 2 mg via INTRAVENOUS
  Filled 2021-10-16: qty 1

## 2021-10-16 MED ORDER — ROCURONIUM BROMIDE 10 MG/ML (PF) SYRINGE
PREFILLED_SYRINGE | INTRAVENOUS | Status: DC | PRN
  Administered 2021-10-16: 20 mg via INTRAVENOUS
  Administered 2021-10-16: 70 mg via INTRAVENOUS
  Administered 2021-10-16: 30 mg via INTRAVENOUS

## 2021-10-16 MED ORDER — LIDOCAINE 2% (20 MG/ML) 5 ML SYRINGE
INTRAMUSCULAR | Status: AC
  Filled 2021-10-16: qty 10

## 2021-10-16 MED ORDER — FENTANYL CITRATE (PF) 100 MCG/2ML IJ SOLN
INTRAMUSCULAR | Status: AC
  Filled 2021-10-16: qty 2

## 2021-10-16 MED ORDER — ROCURONIUM BROMIDE 10 MG/ML (PF) SYRINGE
PREFILLED_SYRINGE | INTRAVENOUS | Status: AC
  Filled 2021-10-16: qty 20

## 2021-10-16 MED ORDER — 0.9 % SODIUM CHLORIDE (POUR BTL) OPTIME
TOPICAL | Status: DC | PRN
  Administered 2021-10-16: 1000 mL

## 2021-10-16 MED ORDER — ONDANSETRON HCL 4 MG/2ML IJ SOLN: 4.0000 mg | Freq: Once | INTRAMUSCULAR | Status: DC | PRN

## 2021-10-16 MED ORDER — MIDAZOLAM HCL 2 MG/2ML IJ SOLN
INTRAMUSCULAR | Status: AC
  Filled 2021-10-16: qty 2

## 2021-10-16 MED ORDER — CELECOXIB 200 MG PO CAPS
200.0000 mg | ORAL_CAPSULE | Freq: Once | ORAL | Status: AC
  Administered 2021-10-16: 200 mg via ORAL
  Filled 2021-10-16: qty 1

## 2021-10-16 MED ORDER — PANTOPRAZOLE SODIUM 40 MG PO TBEC
40.0000 mg | DELAYED_RELEASE_TABLET | Freq: Every day | ORAL | Status: DC
  Administered 2021-10-16 – 2021-10-17 (×2): 40 mg via ORAL
  Filled 2021-10-16 (×2): qty 1

## 2021-10-16 MED ORDER — SODIUM CHLORIDE 0.9 % IV SOLN: 250.0000 mL | INTRAVENOUS | Status: DC

## 2021-10-16 MED ORDER — ONDANSETRON HCL 4 MG/2ML IJ SOLN
INTRAMUSCULAR | Status: AC
  Filled 2021-10-16: qty 2

## 2021-10-16 MED ORDER — DOCUSATE SODIUM 100 MG PO CAPS
100.0000 mg | ORAL_CAPSULE | Freq: Two times a day (BID) | ORAL | Status: DC
  Administered 2021-10-16 – 2021-10-17 (×2): 100 mg via ORAL
  Filled 2021-10-16 (×2): qty 1

## 2021-10-16 MED ORDER — LIDOCAINE-EPINEPHRINE 1 %-1:100000 IJ SOLN
INTRAMUSCULAR | Status: DC | PRN
  Administered 2021-10-16: 10 mL via SUBCUTANEOUS

## 2021-10-16 MED ORDER — CHLORHEXIDINE GLUCONATE 0.12 % MT SOLN
15.0000 mL | Freq: Once | OROMUCOSAL | Status: AC
  Administered 2021-10-16: 15 mL via OROMUCOSAL
  Filled 2021-10-16: qty 15

## 2021-10-16 MED ORDER — LACTATED RINGERS IV SOLN: INTRAVENOUS | Status: DC

## 2021-10-16 MED ORDER — DEXAMETHASONE SODIUM PHOSPHATE 10 MG/ML IJ SOLN
INTRAMUSCULAR | Status: DC | PRN
  Administered 2021-10-16: 10 mg via INTRAVENOUS

## 2021-10-16 MED ORDER — PHENOL 1.4 % MT LIQD: 1.0000 | OROMUCOSAL | Status: DC | PRN

## 2021-10-16 MED ORDER — MENTHOL 3 MG MT LOZG: 1.0000 | LOZENGE | OROMUCOSAL | Status: DC | PRN

## 2021-10-16 MED ORDER — PANTOPRAZOLE SODIUM 40 MG IV SOLR: 40.0000 mg | Freq: Every day | INTRAVENOUS | Status: DC

## 2021-10-16 MED ORDER — ROCURONIUM BROMIDE 10 MG/ML (PF) SYRINGE
PREFILLED_SYRINGE | INTRAVENOUS | Status: AC
  Filled 2021-10-16: qty 10

## 2021-10-16 MED ORDER — PHENYLEPHRINE 80 MCG/ML (10ML) SYRINGE FOR IV PUSH (FOR BLOOD PRESSURE SUPPORT)
PREFILLED_SYRINGE | INTRAVENOUS | Status: AC
  Filled 2021-10-16: qty 10

## 2021-10-16 MED ORDER — SODIUM CHLORIDE 0.9% FLUSH: 3.0000 mL | INTRAVENOUS | Status: DC | PRN

## 2021-10-16 MED ORDER — BISACODYL 10 MG RE SUPP: 10.0000 mg | Freq: Every day | RECTAL | Status: DC | PRN

## 2021-10-16 MED ORDER — OXYCODONE HCL 5 MG PO TABS
10.0000 mg | ORAL_TABLET | ORAL | Status: DC | PRN
  Administered 2021-10-16 – 2021-10-17 (×5): 10 mg via ORAL
  Filled 2021-10-16 (×5): qty 2

## 2021-10-16 MED ORDER — OXYCODONE HCL 5 MG PO TABS: 5.0000 mg | ORAL_TABLET | ORAL | Status: DC | PRN

## 2021-10-16 MED ORDER — PHENYLEPHRINE HCL-NACL 20-0.9 MG/250ML-% IV SOLN
INTRAVENOUS | Status: DC | PRN
  Administered 2021-10-16: 25 ug/min via INTRAVENOUS

## 2021-10-16 MED ORDER — CEFAZOLIN SODIUM-DEXTROSE 2-4 GM/100ML-% IV SOLN
2.0000 g | Freq: Three times a day (TID) | INTRAVENOUS | Status: AC
  Administered 2021-10-16 – 2021-10-17 (×2): 2 g via INTRAVENOUS
  Filled 2021-10-16 (×2): qty 100

## 2021-10-16 SURGICAL SUPPLY — 71 items
ADH SKN CLS APL DERMABOND .7 (GAUZE/BANDAGES/DRESSINGS) ×1
APL SKNCLS STERI-STRIP NONHPOA (GAUZE/BANDAGES/DRESSINGS)
BAG COUNTER SPONGE SURGICOUNT (BAG) ×2 IMPLANT
BAG SPNG CNTER NS LX DISP (BAG) ×1
BASKET BONE COLLECTION (BASKET) ×2 IMPLANT
BENZOIN TINCTURE PRP APPL 2/3 (GAUZE/BANDAGES/DRESSINGS) IMPLANT
BLADE CLIPPER SURG (BLADE) IMPLANT
BLADE SURG 11 STRL SS (BLADE) ×2 IMPLANT
BUR MATCHSTICK NEURO 3.0 LAGG (BURR) ×2 IMPLANT
BUR PRECISION FLUTE 5.0 (BURR) ×2 IMPLANT
CAGE INTERBODY PL SHT 7X22.5 (Plate) ×2 IMPLANT
CANISTER SUCT 3000ML PPV (MISCELLANEOUS) ×2 IMPLANT
CARTRIDGE OIL MAESTRO DRILL (MISCELLANEOUS) ×1 IMPLANT
CNTNR URN SCR LID CUP LEK RST (MISCELLANEOUS) ×1 IMPLANT
CONT SPEC 4OZ STRL OR WHT (MISCELLANEOUS) ×2
COVER BACK TABLE 60X90IN (DRAPES) ×2 IMPLANT
DECANTER SPIKE VIAL GLASS SM (MISCELLANEOUS) ×2 IMPLANT
DERMABOND ADVANCED (GAUZE/BANDAGES/DRESSINGS) ×1
DERMABOND ADVANCED .7 DNX12 (GAUZE/BANDAGES/DRESSINGS) ×1 IMPLANT
DIFFUSER DRILL AIR PNEUMATIC (MISCELLANEOUS) ×2 IMPLANT
DRAPE C-ARM 42X72 X-RAY (DRAPES) ×2 IMPLANT
DRAPE C-ARMOR (DRAPES) ×2 IMPLANT
DRAPE LAPAROTOMY 100X72X124 (DRAPES) ×2 IMPLANT
DRAPE SURG 17X23 STRL (DRAPES) ×2 IMPLANT
DRSG OPSITE POSTOP 4X6 (GAUZE/BANDAGES/DRESSINGS) ×1 IMPLANT
DURAPREP 26ML APPLICATOR (WOUND CARE) ×2 IMPLANT
ELECT REM PT RETURN 9FT ADLT (ELECTROSURGICAL) ×2
ELECTRODE REM PT RTRN 9FT ADLT (ELECTROSURGICAL) ×1 IMPLANT
GAUZE 4X4 16PLY ~~LOC~~+RFID DBL (SPONGE) IMPLANT
GAUZE SPONGE 4X4 12PLY STRL (GAUZE/BANDAGES/DRESSINGS) IMPLANT
GLOVE BIO SURGEON STRL SZ7.5 (GLOVE) IMPLANT
GLOVE BIOGEL PI IND STRL 7.5 (GLOVE) ×2 IMPLANT
GLOVE BIOGEL PI INDICATOR 7.5 (GLOVE) ×2
GLOVE ECLIPSE 7.0 STRL STRAW (GLOVE) ×4 IMPLANT
GLOVE EXAM NITRILE XL STR (GLOVE) IMPLANT
GOWN STRL REUS W/ TWL LRG LVL3 (GOWN DISPOSABLE) ×4 IMPLANT
GOWN STRL REUS W/ TWL XL LVL3 (GOWN DISPOSABLE) IMPLANT
GOWN STRL REUS W/TWL 2XL LVL3 (GOWN DISPOSABLE) IMPLANT
GOWN STRL REUS W/TWL LRG LVL3 (GOWN DISPOSABLE) ×8
GOWN STRL REUS W/TWL XL LVL3 (GOWN DISPOSABLE)
GRAFT BONE PROTEIOS XS 0.5CC (Orthopedic Implant) ×1 IMPLANT
HEMOSTAT POWDER KIT SURGIFOAM (HEMOSTASIS) ×2 IMPLANT
KIT BASIN OR (CUSTOM PROCEDURE TRAY) ×2 IMPLANT
KIT POSITION SURG JACKSON T1 (MISCELLANEOUS) ×2 IMPLANT
KIT TURNOVER KIT B (KITS) ×2 IMPLANT
MILL MEDIUM DISP (BLADE) ×2 IMPLANT
NDL HYPO 18GX1.5 BLUNT FILL (NEEDLE) IMPLANT
NDL SPNL 18GX3.5 QUINCKE PK (NEEDLE) IMPLANT
NEEDLE HYPO 18GX1.5 BLUNT FILL (NEEDLE) IMPLANT
NEEDLE HYPO 22GX1.5 SAFETY (NEEDLE) ×2 IMPLANT
NEEDLE SPNL 18GX3.5 QUINCKE PK (NEEDLE) IMPLANT
NS IRRIG 1000ML POUR BTL (IV SOLUTION) ×2 IMPLANT
OIL CARTRIDGE MAESTRO DRILL (MISCELLANEOUS) ×2
PACK LAMINECTOMY NEURO (CUSTOM PROCEDURE TRAY) ×2 IMPLANT
PAD ARMBOARD 7.5X6 YLW CONV (MISCELLANEOUS) ×6 IMPLANT
PUTTY GRAFTON DBF 6CC W/DELIVE (Putty) ×1 IMPLANT
ROD COBALT 47.5X35 (Rod) ×2 IMPLANT
SCREW SET SOLERA (Screw) ×8 IMPLANT
SCREW SET SOLERA TI (Screw) IMPLANT
SCREW SOLERA 6.5X35 (Screw) ×4 IMPLANT
SPONGE SURGIFOAM ABS GEL 100 (HEMOSTASIS) IMPLANT
SPONGE T-LAP 4X18 ~~LOC~~+RFID (SPONGE) IMPLANT
STRIP CLOSURE SKIN 1/2X4 (GAUZE/BANDAGES/DRESSINGS) IMPLANT
SUT VIC AB 0 CT1 18XCR BRD8 (SUTURE) ×1 IMPLANT
SUT VIC AB 0 CT1 8-18 (SUTURE) ×2
SUT VICRYL 3-0 RB1 18 ABS (SUTURE) ×2 IMPLANT
SYR 3ML LL SCALE MARK (SYRINGE) ×6 IMPLANT
TOWEL GREEN STERILE (TOWEL DISPOSABLE) ×2 IMPLANT
TOWEL GREEN STERILE FF (TOWEL DISPOSABLE) ×2 IMPLANT
TRAY FOLEY MTR SLVR 16FR STAT (SET/KITS/TRAYS/PACK) ×2 IMPLANT
WATER STERILE IRR 1000ML POUR (IV SOLUTION) ×2 IMPLANT

## 2021-10-16 NOTE — Transfer of Care (Signed)
Immediate Anesthesia Transfer of Care Note ? ?Patient: Douglas Henry ? ?Procedure(s) Performed: LUMBAR FOUR-FIVE POSTERIOR LUMBAR INTERBODY FUSION (Spine Lumbar) ? ?Patient Location: PACU ? ?Anesthesia Type:General ? ?Level of Consciousness: awake and oriented ? ?Airway & Oxygen Therapy: Patient Spontanous Breathing and Patient connected to nasal cannula oxygen ? ?Post-op Assessment: Report given to RN and Patient moving all extremities X 4 ? ?Post vital signs: Reviewed and stable ? ?Last Vitals:  ?Vitals Value Taken Time  ?BP 126/93 10/16/21 1650  ?Temp    ?Pulse 101 10/16/21 1651  ?Resp 15 10/16/21 1651  ?SpO2 89 % 10/16/21 1651  ?Vitals shown include unvalidated device data. ? ?Last Pain:  ?Vitals:  ? 10/16/21 1115  ?TempSrc:   ?PainSc: 2   ?   ? ?Patients Stated Pain Goal: 2 (10/16/21 1115) ? ?Complications: No notable events documented. ?

## 2021-10-16 NOTE — Anesthesia Postprocedure Evaluation (Signed)
Anesthesia Post Note ? ?Patient: Douglas Henry ? ?Procedure(s) Performed: LUMBAR FOUR-FIVE POSTERIOR LUMBAR INTERBODY FUSION (Spine Lumbar) ? ?  ? ?Patient location during evaluation: PACU ?Anesthesia Type: General ?Level of consciousness: awake ?Pain management: pain level controlled ?Vital Signs Assessment: post-procedure vital signs reviewed and stable ?Respiratory status: spontaneous breathing, nonlabored ventilation, respiratory function stable and patient connected to nasal cannula oxygen ?Cardiovascular status: blood pressure returned to baseline and stable ?Postop Assessment: no apparent nausea or vomiting ?Anesthetic complications: no ? ? ?No notable events documented. ? ?Last Vitals:  ?Vitals:  ? 10/16/21 1806 10/16/21 2014  ?BP: 122/83 124/84  ?Pulse: 83 83  ?Resp: 18 18  ?Temp: 36.5 ?C 36.6 ?C  ?SpO2: 97% 94%  ?  ?Last Pain:  ?Vitals:  ? 10/16/21 2014  ?TempSrc: Oral  ?PainSc:   ? ? ?  ?  ?  ?  ?  ?  ? ?Shena Vinluan P Torunn Chancellor ? ? ? ? ?

## 2021-10-16 NOTE — Anesthesia Procedure Notes (Signed)
Procedure Name: Intubation ?Date/Time: 10/16/2021 1:19 PM ?Performed by: Barrington Ellison, CRNA ?Pre-anesthesia Checklist: Patient identified, Emergency Drugs available, Suction available and Patient being monitored ?Patient Re-evaluated:Patient Re-evaluated prior to induction ?Oxygen Delivery Method: Circle System Utilized ?Preoxygenation: Pre-oxygenation with 100% oxygen ?Induction Type: IV induction ?Ventilation: Mask ventilation without difficulty ?Laryngoscope Size: Mac and 4 ?Grade View: Grade I ?Tube type: Oral ?Tube size: 7.5 mm ?Number of attempts: 1 ?Airway Equipment and Method: Stylet and Oral airway ?Placement Confirmation: ETT inserted through vocal cords under direct vision, positive ETCO2 and breath sounds checked- equal and bilateral ?Secured at: 22 cm ?Tube secured with: Tape ?Dental Injury: Teeth and Oropharynx as per pre-operative assessment  ? ? ? ? ?

## 2021-10-16 NOTE — H&P (Signed)
?Chief Complaint  ?Back and leg pain ? ?History of Present Illness  ?Douglas Henry is a 66 y.o. male Initially seen in the Outpatient Neurosurgery Clinic with back and left greater than right leg pain. We have been managing low back and bilateral, left greater than right leg pain conservatively.  He had previously been treated by Dr. Newell Coral and had excellent pain improvement lasting nearly three years with an L5-S1 epidural steroid injection.  A few months ago he had noted several months of progressively worsening recurrence pain.  At that time because of his prior experience with the L5-S1 injection, we referred him for the same to Dr. Lorrine Kin. He has had multiple further injections but he continues to describe pain which radiates through the buttock, lateral aspect of the thigh, and into the anteromedial aspect of the leg.  He has pain and that runs into the medial aspect of his foot.  He gets pain on both sides, although the left is slightly worse than the right. ? ?Past Medical History  ? ?Past Medical History:  ?Diagnosis Date  ? Medical history non-contributory   ? Skin cysts, generalized   ? ? ?Past Surgical History  ? ?Past Surgical History:  ?Procedure Laterality Date  ? CATARACT EXTRACTION W/PHACO Left 05/31/2020  ? Procedure: CATARACT EXTRACTION PHACO AND INTRAOCULAR LENS PLACEMENT (IOC);  Surgeon: [name], MD;  Location: [facility];  Service: Ophthalmology;  Laterality: Left;  CDE: 8.26  ? CATARACT EXTRACTION W/PHACO Right 06/14/2020  ? Procedure: CATARACT EXTRACTION PHACO AND INTRAOCULAR LENS PLACEMENT RIGHT EYE;  Surgeon: [name], MD;  Location: [facility];  Service: Ophthalmology;  Laterality: Right;  CDE 10.91  ? JOINT REPLACEMENT    ? scope  rt knee  ? LUMBAR LAMINECTOMY/DECOMPRESSION MICRODISCECTOMY Left 12/24/2014  ? Procedure: LEFT LUMBAR FOUR-FIVE LUMBAR LAMINECTOMY/DECOMPRESSION MICRODISCECTOMY ;  Surgeon: Shirlean Kelly, MD;  Location: [facility];  Service: Neurosurgery;   Laterality: Left;  Left L45 laminotomy, extraforaminal exploration and microdiskectomy  ? MECKEL DIVERTICULUM EXCISION    ? ROTATOR CUFF REPAIR    ? sagill   spilt    ? ? ?Social History  ? ?Social History  ? ?Tobacco Use  ? Smoking status: Every Day  ?  Packs/day: 1.00  ?  Years: 38.00  ?  Pack years: 38.00  ?  Types: Cigarettes  ? Smokeless tobacco: Never  ?Vaping Use  ? Vaping Use: Never used  ?Substance Use Topics  ? Alcohol use: Yes  ?  Alcohol/week: 6.0 standard drinks  ?  Types: 6 Cans of beer per week  ?  Comment: Has not had any in the last 6 weeks, preparing for surgery-10/09/21  ? Drug use: Never  ? ? ?Medications  ? ?Prior to Admission medications   ?Medication Sig Start Date End Date Taking? Authorizing Provider  ?acetaminophen (TYLENOL) 500 MG tablet Take 1,000 mg by mouth every 6 (six) hours as needed.   Yes [provider]  ?ibuprofen (ADVIL,MOTRIN) 200 MG tablet Take 400 mg by mouth every 6 (six) hours as needed for mild pain or moderate pain.   Yes [provider]  ?lidocaine (LMX) 4 % cream Apply 1 application. topically as needed (pain).   Yes [provider]  ?Multiple Vitamins-Minerals (MULTIVITAMIN WITH MINERALS) tablet Take 1 tablet by mouth daily.   Yes [provider]  ?naproxen sodium (ALEVE) 220 MG tablet Take 440 mg by mouth daily as needed (pain).   Yes [provider]  ?Polyethyl Glycol-Propyl Glycol (LUBRICATING  EYE DROPS) 0.4-0.3 % SOLN Place 1 drop into both eyes as needed (dry eyes).   Yes [provider]  ? ? ?Allergies  ? ?Allergies  ?Allergen Reactions  ? Darvon [Propoxyphene] Nausea Only  ?  Darvocet  ? Lexapro [Escitalopram Oxalate] Nausea Only  ? ? ?Review of Systems  ?ROS ? ?Neurologic Exam  ?Awake, alert, oriented ?Memory and concentration grossly intact ?Speech fluent, appropriate ?CN grossly intact ?Motor exam: ?Upper Extremities Deltoid Bicep Tricep Grip  ?Right 5/5 5/5 5/5 5/5  ?Left 5/5 5/5 5/5 5/5  ? ?Lower  Extremities IP Quad PF DF EHL  ?Right 5/5 5/5 5/5 5/5 5/5  ?Left 5/5 5/5 5/5 5/5 5/5  ? ?Sensation grossly intact to LT ? ?Imaging  ?MRI of the lumbar spine dated 10/21/2020 was again reviewed and demonstrates primary finding at L4-5 where there is bilateral facet arthropathy, grade 1 degenerative anterolisthesis, and severe bilateral lateral recess stenosis and severe left-sided foraminal stenosis. ? ?Impression  ?- 66 y.o. male   With back and left greater than right leg pain related to multifactorial stenosis with spondylolisthesis at L4-5 without significant improvement with multiple conservative treatments. ? ?Plan  ?-   We will plan on proceeding with surgical decompression and fusion at L4-5 ? ? I have reviewed the details of the operation as well as the expected postoperative course and recovery in detail with the patient in the office.  We have also discussed the associated risks, benefits, and alternatives to surgery.  All his questions stay were answered and he  provided informed consent to proceed. ? ?[name], MD ?[facility]  ? ?

## 2021-10-16 NOTE — Anesthesia Preprocedure Evaluation (Addendum)
Anesthesia Evaluation  ?Patient identified by MRN, date of birth, ID band ?Patient awake ? ? ? ?Reviewed: ?Allergy & Precautions, NPO status , Patient's Chart, lab work & pertinent test results ? ?History of Anesthesia Complications ?Negative for: history of anesthetic complications ? ?Airway ?Mallampati: II ? ?TM Distance: >3 FB ?Neck ROM: Full ? ? ? Dental ? ?(+) Dental Advisory Given, Teeth Intact ?  ?Pulmonary ?Current SmokerPatient did not abstain from smoking.,  ?  ?Pulmonary exam normal ? ? ? ? ? ? ? Cardiovascular ?negative cardio ROS ?Normal cardiovascular exam ? ? ?  ?Neuro/Psych ?negative neurological ROS ? negative psych ROS  ? GI/Hepatic ?negative GI ROS, Neg liver ROS,   ?Endo/Other  ?negative endocrine ROS ? Renal/GU ?negative Renal ROS  ? ?  ?Musculoskeletal ?negative musculoskeletal ROS ?(+)  ? Abdominal ?  ?Peds ? Hematology ?negative hematology ROS ?(+)   ?Anesthesia Other Findings ? ? Reproductive/Obstetrics ? ?  ? ? ? ? ? ? ? ? ? ? ? ? ? ?  ?  ? ? ? ? ? ? ? ?Anesthesia Physical ?Anesthesia Plan ? ?ASA: 2 ? ?Anesthesia Plan: General  ? ?Post-op Pain Management: Tylenol PO (pre-op)* and Celebrex PO (pre-op)*  ? ?Induction: Intravenous ? ?PONV Risk Score and Plan: 1 and Treatment may vary due to age or medical condition, Ondansetron, Dexamethasone and Midazolam ? ?Airway Management Planned: Oral ETT ? ?Additional Equipment: None ? ?Intra-op Plan:  ? ?Post-operative Plan: Extubation in OR ? ?Informed Consent: I have reviewed the patients History and Physical, chart, labs and discussed the procedure including the risks, benefits and alternatives for the proposed anesthesia with the patient or authorized representative who has indicated his/her understanding and acceptance.  ? ? ? ?Dental advisory given ? ?Plan Discussed with: CRNA and Anesthesiologist ? ?Anesthesia Plan Comments:   ? ? ? ? ? ?Anesthesia Quick Evaluation ? ?

## 2021-10-17 DIAGNOSIS — M47816 Spondylosis without myelopathy or radiculopathy, lumbar region: Secondary | ICD-10-CM | POA: Diagnosis not present

## 2021-10-17 DIAGNOSIS — M4316 Spondylolisthesis, lumbar region: Secondary | ICD-10-CM | POA: Diagnosis not present

## 2021-10-17 DIAGNOSIS — F17210 Nicotine dependence, cigarettes, uncomplicated: Secondary | ICD-10-CM | POA: Diagnosis not present

## 2021-10-17 DIAGNOSIS — M48062 Spinal stenosis, lumbar region with neurogenic claudication: Secondary | ICD-10-CM | POA: Diagnosis not present

## 2021-10-17 DIAGNOSIS — Z96651 Presence of right artificial knee joint: Secondary | ICD-10-CM | POA: Diagnosis not present

## 2021-10-17 DIAGNOSIS — Z79899 Other long term (current) drug therapy: Secondary | ICD-10-CM | POA: Diagnosis not present

## 2021-10-17 MED ORDER — OXYCODONE HCL 10 MG PO TABS
10.0000 mg | ORAL_TABLET | Freq: Four times a day (QID) | ORAL | 0 refills | Status: AC | PRN
Start: 2021-10-17 — End: 2021-10-24

## 2021-10-17 MED ORDER — METHOCARBAMOL 500 MG PO TABS: 500.0000 mg | ORAL_TABLET | Freq: Four times a day (QID) | ORAL | 0 refills | Status: DC | PRN

## 2021-10-17 NOTE — Plan of Care (Signed)
Pt doing well. Pt given D/C instructions with verbal understanding. Rx's were sent to the pharmacy by MD. Pt's incision is clean and dry with no sign of infection. Pt's IV was removed prior to D/C. Pt D/C'd home via wheelchair per MD order. Pt is stable @ D/C and has no other needs at this time. Kebra Lowrimore, RN  

## 2021-10-17 NOTE — Discharge Summary (Signed)
?Physician Discharge Summary  ?Patient ID: ?Douglas Henry ?MRN: [id number] ?DOB/AGE: 09-20-1955 66 y.o. ? ?Admit date: 10/16/2021 ?Discharge date: 10/17/2021 ? ?Admission Diagnoses:  ?Spondylolisthesis L4-5 ? ?Discharge Diagnoses:  ?Same ?Principal Problem: ?  Spondylolisthesis at L4-L5 level ? ? ?Discharged Condition: Stable ? ?Hospital Course:  ?Douglas Henry is a 66 y.o. male admitted after elective L4-5 PLIF. He was at baseline postop reporting significant improvement in leg pain. He was ambulating well, tolerating diet, voiding normally with pain under control and requested discharge. ? ?Treatments: Surgery - L4-5 PLIF ? ?Discharge Exam: ?Blood pressure 114/69, pulse 100, temperature 98.1 ?F (36.7 ?C), temperature source Oral, resp. rate 20, height 6' (1.829 m), weight 75.8 kg, SpO2 92 %. ?Awake, alert, oriented ?Speech fluent, appropriate ?CN grossly intact ?5/5 BUE/BLE ?Wound c/d/i ? ?Disposition: Discharge disposition: 01-Home or Self Care ? ? ? ? ? ? ?Discharge Instructions   ? ? Call MD for:  redness, tenderness, or signs of infection (pain, swelling, redness, odor or green/yellow discharge around incision site)   Complete by: As directed ?  ? Call MD for:  temperature >100.4   Complete by: As directed ?  ? Diet - low sodium heart healthy   Complete by: As directed ?  ? Discharge instructions   Complete by: As directed ?  ? Walk at home as much as possible, at least 4 times / day  ? Incentive spirometry RT   Complete by: As directed ?  ? Increase activity slowly   Complete by: As directed ?  ? Lifting restrictions   Complete by: As directed ?  ? No lifting > 10 lbs  ? May shower / Bathe   Complete by: As directed ?  ? 48 hours after surgery  ? May walk up steps   Complete by: As directed ?  ? Other Restrictions   Complete by: As directed ?  ? No bending/twisting at waist  ? Remove dressing in 24 hours   Complete by: As directed ?  ? ?  ? ?Allergies as of 10/17/2021   ? ?   Reactions  ? Darvon  [propoxyphene] Nausea Only  ? Darvocet  ? Lexapro [escitalopram Oxalate] Nausea Only  ? ?  ? ?  ?Medication List  ?  ? ?TAKE these medications   ? ?acetaminophen 500 MG tablet ?Commonly known as: TYLENOL ?Take 1,000 mg by mouth every 6 (six) hours as needed. ?  ?ibuprofen 200 MG tablet ?Commonly known as: ADVIL ?Take 400 mg by mouth every 6 (six) hours as needed for mild pain or moderate pain. ?  ?lidocaine 4 % cream ?Commonly known as: LMX ?Apply 1 application. topically as needed (pain). ?  ?Lubricating Eye Drops 0.4-0.3 % Soln ?Generic drug: Polyethyl Glycol-Propyl Glycol ?Place 1 drop into both eyes as needed (dry eyes). ?  ?methocarbamol 500 MG tablet ?Commonly known as: ROBAXIN ?Take 1 tablet (500 mg total) by mouth every 6 (six) hours as needed for muscle spasms. ?  ?multivitamin with minerals tablet ?Take 1 tablet by mouth daily. ?  ?naproxen sodium 220 MG tablet ?Commonly known as: ALEVE ?Take 440 mg by mouth daily as needed (pain). ?  ?Oxycodone HCl 10 MG Tabs ?Take 1 tablet (10 mg total) by mouth every 6 (six) hours as needed for up to 7 days for severe pain ((score 7 to 10)). ?  ? ?  ? ? Follow-up Information   ? ? Consuella Lose, MD Follow up.   ?Specialty: Neurosurgery ?Contact information: ?[address] ?[address] ?[place] [address] ?[phone] ? ? ?  ?  ? ?  ?  ? ?  ? ? ?Signed: ?Jairo Ben ?10/17/2021, 10:39 AM ? ? ?

## 2021-10-17 NOTE — Evaluation (Signed)
Occupational Therapy Evaluation ?Patient Details ?Name: Douglas Henry ?MRN: [id number] ?DOB: 08/16/1955 ?Today's Date: 10/17/2021 ? ? ?History of Present Illness 66 yo male s/p L5-S1 fusion on 4/20. PMH including cataract extraction (2021), R TKA, and lumbar lami/decompression (2016).  ? ?Clinical Impression ?  ?PTA, pt was living with his wife and was independent. Currently, pt performing ADLs and functional mobility at Mod I level; using RW for comfort while walking in hallway. Provided education and handout on back precautions, bed mobility, brace management, grooming, UB ADLs, LB ADLs, toileting, and tub transfer with shower seat; pt demonstrated understanding. Answered all pt questions. Recommend dc home once medically stable per physician. All acute OT needs met and will sign off. Thank you.   ? ?Recommendations for follow up therapy are one component of a multi-disciplinary discharge planning process, led by the attending physician.  Recommendations may be updated based on patient status, additional functional criteria and insurance authorization.  ? ?Follow Up Recommendations ? No OT follow up  ?  ?Assistance Recommended at Discharge Intermittent Supervision/Assistance  ?Patient can return home with the following Assistance with cooking/housework;Assist for transportation ? ?  ?Functional Status Assessment ? Patient has had a recent decline in their functional status and demonstrates the ability to make significant improvements in function in a reasonable and predictable amount of time.  ?Equipment Recommendations ? Other (comment) (RW)  ?  ?Recommendations for Other Services   ? ? ?  ?Precautions / Restrictions Precautions ?Precautions: Back ?Precaution Booklet Issued: Yes (comment) ?Precaution Comments: reviewed back precautions and compensatory tehcniques ?Required Braces or Orthoses: Spinal Brace ?Spinal Brace: Lumbar corset;Applied in sitting position ?Restrictions ?Weight Bearing Restrictions: No  ? ?   ? ?Mobility Bed Mobility ?  ?  ?  ?  ?  ?  ?  ?General bed mobility comments: Sitting at EOB upon arrival. reviewed log roll technique ?  ? ?Transfers ?Overall transfer level: Modified independent ?  ?  ?  ?  ?  ?  ?  ?  ?General transfer comment: increased time ?  ? ?  ?Balance Overall balance assessment: No apparent balance deficits (not formally assessed) ?  ?  ?  ?  ?  ?  ?  ?  ?  ?  ?  ?  ?  ?  ?  ?  ?  ?  ?   ? ?ADL either performed or assessed with clinical judgement  ? ?ADL Overall ADL's : Modified independent ?  ?  ?  ?  ?  ?  ?  ?  ?  ?  ?  ?  ?  ?  ?  ?  ?  ?  ?  ?General ADL Comments: Pt performing ADLs and functional mobility at Mod I level with increased time as needed. Providing education on back precautions, bed mobility, brace management, LB ADLs, tub transfer, and toileting. Using RW for comfort during mobility in hallway  ? ? ? ?Vision   ?   ?   ?Perception   ?  ?Praxis   ?  ? ?Pertinent Vitals/Pain Pain Assessment ?Pain Assessment: Faces ?Faces Pain Scale: Hurts little more ?Pain Location: back ?Pain Descriptors / Indicators: Discomfort ?Pain Intervention(s): Monitored during session, Repositioned  ? ? ? ?Hand Dominance   ?  ?Extremity/Trunk Assessment Upper Extremity Assessment ?Upper Extremity Assessment: Overall WFL for tasks assessed ?  ?Lower Extremity Assessment ?Lower Extremity Assessment: Overall WFL for tasks assessed ?  ?Cervical / Trunk Assessment ?Cervical / Trunk Assessment:  Back Surgery ?  ?Communication Communication ?Communication: No difficulties ?  ?Cognition Arousal/Alertness: Awake/alert ?Behavior During Therapy: [facility] for tasks assessed/performed ?Overall Cognitive Status: Within Functional Limits for tasks assessed ?  ?  ?  ?  ?  ?  ?  ?  ?  ?  ?  ?  ?  ?  ?  ?  ?  ?  ?  ?General Comments    ? ?  ?Exercises   ?  ?Shoulder Instructions    ? ? ?Home Living Family/patient expects to be discharged to:: Private residence ?Living Arrangements: Spouse/significant  other;Children ?Available Help at Discharge: Family;Available 24 hours/day ?Type of Home: House ?Home Access: Stairs to enter ?Entrance Stairs-Number of Steps: 1 ?Entrance Stairs-Rails: None ?Home Layout: One level ?  ?  ?Bathroom Shower/Tub: Tub/shower unit ?  ?Bathroom Toilet: Standard ?  ?  ?Home Equipment: Shower seat;Grab bars - tub/shower ?  ?  ?  ? ?  ?Prior Functioning/Environment Prior Level of Function : Independent/Modified Independent;Driving ?  ?  ?  ?  ?  ?  ?  ?ADLs Comments: Retired. Enjoys hiking and traveling. ?  ? ?  ?  ?OT Problem List: Decreased activity tolerance;Decreased strength;Decreased knowledge of use of DME or AE;Decreased knowledge of precautions ?  ?   ?OT Treatment/Interventions:    ?  ?OT Goals(Current goals can be found in the care plan section) Acute Rehab OT Goals ?Patient Stated Goal: Go home ?OT Goal Formulation: All assessment and education complete, DC therapy  ?OT Frequency:   ?  ? ?Co-evaluation   ?  ?  ?  ?  ? ?  ?AM-PAC OT "6 Clicks" Daily Activity     ?Outcome Measure Help from another person eating meals?: None ?Help from another person taking care of personal grooming?: None ?Help from another person toileting, which includes using toliet, bedpan, or urinal?: None ?Help from another person bathing (including washing, rinsing, drying)?: None ?Help from another person to put on and taking off regular upper body clothing?: None ?Help from another person to put on and taking off regular lower body clothing?: None ?6 Click Score: 24 ?  ?End of Session Equipment Utilized During Treatment: Rolling walker (2 wheels) (Wife bringing back brace) ?Nurse Communication: Mobility status ? ?Activity Tolerance: Patient tolerated treatment well ?Patient left: in chair;with call bell/phone within reach ? ?OT Visit Diagnosis: Other abnormalities of gait and mobility (R26.89);Muscle weakness (generalized) (M62.81)  ?              ?Time: 4854-6270 ?OT Time Calculation (min): 27  min ?Charges:  OT General Charges ?$OT Visit: 1 Visit ?OT Evaluation ?$OT Eval Low Complexity: 1 Low ?OT Treatments ?$Self Care/Home Management : 8-22 mins ? ?Kayliah Tindol MSOT, OTR/L ?Acute Rehab ?Pager: [phone] ?Office: [phone] ? ?Menachem Urbanek M Abeer Iversen ?10/17/2021, 9:03 AM ?

## 2021-10-17 NOTE — Progress Notes (Signed)
PT Cancellation Note ? ?Patient Details ?Name: Douglas Henry ?MRN: [id number] ?DOB: 04-22-1956 ? ? ?Cancelled Treatment:    Reason Eval/Treat Not Completed: PT screened, no needs identified, will sign off Per OT, patient mobilizing well but with hesitation post spinal surgery. No skilled PT needs identified. PT will sign off.  ? ?Nelida Mandarino A. Gilford Rile, PT, DPT ?Acute Rehabilitation Services ?Pager [phone] ?Office [phone] ? ?Idalee Foxworthy A Thara Searing ?10/17/2021, 8:06 AM ?

## 2021-10-20 NOTE — Op Note (Signed)
?NEUROSURGERY OPERATIVE NOTE  ? ?PREOP DIAGNOSIS:  ?1. Lumbar spondylosis, L4-5 ?2. Spondylolisthesis, L4-5 ?3. Lumbar stenosis with neurogenic claudication ? ?POSTOP DIAGNOSIS: Same ? ?PROCEDURE: ?1. L4 laminectomy with facetectomy for decompression of exiting nerve roots, more than would be required for placement of interbody graft ?2. Placement of anterior interbody device - [employer]tronic expandable cage x2 ?3. Posterior non-segmental instrumentation using cortical pedicle screws at L4 - L5 ?4. Interbody arthrodesis, L4-5 ?5. Use of locally harvested bone autograft ?6. Use of non-structural bone allograft - DBM, ProteiOs ? ?SURGEON: Dr. [name], MD ? ?ASSISTANT: Dr. Hoyt Koch, MD ? ?ANESTHESIA: General Endotracheal ? ?EBL: 150cc ? ?SPECIMENS: None ? ?DRAINS: None ? ?COMPLICATIONS: None immediate ? ?CONDITION: Hemodynamically stable to PACU ? ?HISTORY: ?Douglas Henry is a 66 y.o. male who has been followed in the outpatient clinic with back and L>R leg pain related to stenosis, spondylosis, and spondylolithesis at L4-5. He had previously undergone laminotomy at this level by Dr. Newell Coral. Multiple conservative treatments were attempted without significant improvement and we ultimately elected to proceed with surgical decompression and fusion. Risks, benefits, and alternative treatments were reviewed in detail in the office. After all questions were answered, informed consent was obtained and witnessed. ? ?PROCEDURE IN DETAIL: ?The patient was brought to the operating room via stretcher. After induction of general anesthesia, the patient was positioned on the operative table in the prone position. All pressure points were meticulously padded. Incision was then marked out and prepped and draped in the usual sterile fashion. ? ?After timeout was conducted, previous skin was infiltrated with local anesthetic. Skin incision was then made sharply and Bovie electrocautery was used to dissect the  subcutaneous tissue until the lumbodorsal fascia was identified and incised. The muscle was then elevated in the subperiosteal plane and the L4 lamina and L4-5 facet complexes were identified. Self-retaining retractors were then placed. Lateral fluoroscopy was taken with a dissector in the L4-5 interspace to confirm our location. ? ?At this point attention was turned to decompression. Complete L4 laminectomy was completed with a high-speed drill and Kerrison punches. Small previous L4 laminotomy defect on the left sided was identified.  Normal dura was identified.  A ball-tipped dissector was then used to identify the foramina bilaterally.  High-speed drill was used to cut across the pars interarticularis and the inferior articulating process of L4 was removed bilaterally.  The exiting nerve roots and the traversing nerve roots were then identified.  Decompression was completed with Kerrison punches. I was then able to easily pass a ball dissector in the ventral epidural space and underneath the bilateral L4 and L5 nerves indicating good decompression.  ? ?Disc space was then identified. There was significant epidural fibrosis on the right. Disc space was incised bilaterally, and using a combination of shavers, curettes and rongeurs, complete discectomy was completed. Endplates were prepared, and bone harvested during decompression was mixed with DBM and ProteiOs and packed into the interspace. A 92mm expandable cage was tapped into place bilaterally. cages were expanded to achieve good endplat apposition.  Good position was confirmed with fluoroscopy. ? ?At this point, the entry points for bilateral L4 and L5 cortical pedicle screws were identified using standard anatomic landmarks and lateral fluoro. Pilot holes were then drilled and tapped to 6.5 x 5mm. Screws were then placed in L4 and L5. Prebent lordotic rod was then sized and placed into the pedicle screws. Set screws were placed and final tightened. Final AP  and lateral fluoroscopic images confirmed  good position. ? ?Hemostasis was secured and confirmed with bipolar cautery and morcellized gelfoam with thrombin. The wound was then irrigated with copious amounts of antibiotic saline, then closed in standard fashion using a combination of interrupted 0 and 3-0 Vicryl stitches in the muscular, fascial, and subcutaneous layers. Skin was then closed using standard Dermabond. Sterile dressing was then applied. The patient was then transferred to the stretcher, extubated, and taken to the postanesthesia care unit in stable hemodynamic condition. ? ?At the end of the case all sponge, needle, cottonoid, and instrument counts were correct. ? ? ?[name], MD ?[facility]  ? ?

## 2021-10-24 DIAGNOSIS — F17210 Nicotine dependence, cigarettes, uncomplicated: Secondary | ICD-10-CM | POA: Diagnosis not present

## 2021-10-24 DIAGNOSIS — F32A Depression, unspecified: Secondary | ICD-10-CM | POA: Diagnosis not present

## 2021-10-24 DIAGNOSIS — M545 Low back pain, unspecified: Secondary | ICD-10-CM | POA: Diagnosis not present

## 2021-10-24 DIAGNOSIS — Z6824 Body mass index (BMI) 24.0-24.9, adult: Secondary | ICD-10-CM | POA: Diagnosis not present

## 2021-10-24 DIAGNOSIS — Z299 Encounter for prophylactic measures, unspecified: Secondary | ICD-10-CM | POA: Diagnosis not present

## 2021-11-12 DIAGNOSIS — M4316 Spondylolisthesis, lumbar region: Secondary | ICD-10-CM | POA: Diagnosis not present

## 2021-11-18 DIAGNOSIS — Z713 Dietary counseling and surveillance: Secondary | ICD-10-CM | POA: Diagnosis not present

## 2021-11-18 DIAGNOSIS — F17210 Nicotine dependence, cigarettes, uncomplicated: Secondary | ICD-10-CM | POA: Diagnosis not present

## 2021-11-18 DIAGNOSIS — Z6824 Body mass index (BMI) 24.0-24.9, adult: Secondary | ICD-10-CM | POA: Diagnosis not present

## 2021-11-18 DIAGNOSIS — Z7189 Other specified counseling: Secondary | ICD-10-CM | POA: Diagnosis not present

## 2021-11-18 DIAGNOSIS — Z0000 Encounter for general adult medical examination without abnormal findings: Secondary | ICD-10-CM | POA: Diagnosis not present

## 2021-11-18 DIAGNOSIS — Z299 Encounter for prophylactic measures, unspecified: Secondary | ICD-10-CM | POA: Diagnosis not present

## 2021-11-18 DIAGNOSIS — R5383 Other fatigue: Secondary | ICD-10-CM | POA: Diagnosis not present

## 2021-12-24 DIAGNOSIS — Z6824 Body mass index (BMI) 24.0-24.9, adult: Secondary | ICD-10-CM | POA: Diagnosis not present

## 2021-12-24 DIAGNOSIS — Z1211 Encounter for screening for malignant neoplasm of colon: Secondary | ICD-10-CM | POA: Diagnosis not present

## 2022-02-27 ENCOUNTER — Other Ambulatory Visit: Payer: Self-pay | Admitting: *Deleted

## 2022-02-27 ENCOUNTER — Encounter: Payer: Self-pay | Admitting: *Deleted

## 2022-02-27 NOTE — Patient Instructions (Signed)
Visit Information  Thank you for taking time to visit with me today. Please don't hesitate to contact me if I can be of assistance to you.  Following are the goals we discussed today:  Continue activities as tolerated.  Call insurance company to inquire about referral process and obtain neurology specialists in network.  Please call the Suicide and Crisis Lifeline: 988 call the [place] National Suicide Prevention Lifeline: 551-731-0807 or TTY: (310)794-3602 TTY 339 420 9284) to talk to a trained counselor call 1-800-273-TALK (toll free, 24 hour hotline) call the [facility]: 334-309-1979 call 911 if you are experiencing a Mental Health or Behavioral Health Crisis or need someone to talk to.  Patient verbalizes understanding of instructions and care plan provided today and agrees to view in MyChart. Active MyChart status and patient understanding of how to access instructions and care plan via MyChart confirmed with patient.     The patient has been provided with contact information for the care management team and has been advised to call with any health related questions or concerns.   Kemper Durie, RN, MSN, [facility] Care Management Care Management Coordinator [phone]

## 2022-02-27 NOTE — Patient Outreach (Signed)
  Care Coordination   Initial Visit Note   02/27/2022 Name: CLABORN JANUSZ MRN: [id number] DOB: 1955/08/04  [place] Droke is a 66 y.o. year old male who sees Kirstie Peri, MD for primary care. I spoke with  Newt Lukes by phone today.  What matters to the patients health and wellness today?  Report he had back surgery in April of this year.  Has done well with recovery, continues to do exercises and almost back to normal activities.  Followed up with ortho on 6/21 and was released from care.  Denies need for ongoing follow up.   Goals Addressed             This Visit's Progress    COMPLETED: Care Coordination Activities - No follow up needed       Care Coordination Interventions: Patient interviewed about adult health maintenance status including  Colonoscopy    Depression screen    Falls risk assessment    COVID vaccination    Regular eye checkups Regular Dental Care    Advised patient to discuss  tingling in toes with primary care provider  Provided education about overall health maintenance         SDOH assessments and interventions completed:  Yes  SDOH Interventions Today    Flowsheet Row Most Recent Value  SDOH Interventions   Food Insecurity Interventions Intervention Not Indicated  Financial Strain Interventions Intervention Not Indicated  Housing Interventions Intervention Not Indicated  Transportation Interventions Intervention Not Indicated        Care Coordination Interventions Activated:  Yes  Care Coordination Interventions:  Yes, provided   Follow up plan: No further intervention required.   Encounter Outcome:  Pt. Visit Completed   Kemper Durie, RN, MSN, [facility] Care Management Care Management Coordinator [phone]

## 2022-03-19 DIAGNOSIS — K621 Rectal polyp: Secondary | ICD-10-CM | POA: Diagnosis not present

## 2022-03-19 DIAGNOSIS — K635 Polyp of colon: Secondary | ICD-10-CM | POA: Diagnosis not present

## 2022-03-19 DIAGNOSIS — Z1211 Encounter for screening for malignant neoplasm of colon: Secondary | ICD-10-CM | POA: Diagnosis not present

## 2022-03-19 DIAGNOSIS — Z87891 Personal history of nicotine dependence: Secondary | ICD-10-CM | POA: Diagnosis not present

## 2022-03-19 DIAGNOSIS — K5730 Diverticulosis of large intestine without perforation or abscess without bleeding: Secondary | ICD-10-CM | POA: Diagnosis not present

## 2022-04-08 DIAGNOSIS — K621 Rectal polyp: Secondary | ICD-10-CM | POA: Diagnosis not present

## 2022-04-08 DIAGNOSIS — Z6824 Body mass index (BMI) 24.0-24.9, adult: Secondary | ICD-10-CM | POA: Diagnosis not present

## 2022-04-08 DIAGNOSIS — K5790 Diverticulosis of intestine, part unspecified, without perforation or abscess without bleeding: Secondary | ICD-10-CM | POA: Diagnosis not present

## 2022-04-15 DIAGNOSIS — M4316 Spondylolisthesis, lumbar region: Secondary | ICD-10-CM | POA: Diagnosis not present

## 2022-04-16 ENCOUNTER — Encounter: Payer: Self-pay | Admitting: Neurology

## 2022-05-12 NOTE — Progress Notes (Signed)
[facility] Neurology Division Clinic Note - Initial Visit   Date: 05/13/2022   PER NOTARO MRN: [id number] DOB: 08-Jan-1956   Dear Dr. Conchita Paris:  Thank you for your kind referral of Douglas Henry for consultation of bilateral feet numbness/tingling. Although his history is well known to you, please allow [place] to reiterate it for the purpose of our medical record. The patient was accompanied to the clinic by self.    Douglas Henry is a 66 y.o. right-handed male with s/p L4-5 laminectomy and decompression (09/2021) and prior tobacco use presenting for evaluation of bilateral feet numbness/tingling.   IMPRESSION/PLAN: Bilateral feet paresthesias, most consistent with neuropathy.  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 I had extensive discussion with the patient regarding the pathogenesis, etiology, management, and natural course of neuropathy. Neuropathy tends to be slowly progressive, especially if a treatable etiology is not identified.  I would like to test for treatable causes of neuropathy. I discussed that in the vast majority of cases, despite checking for reversible causes, we are unable to find the underlying etiology and management is symptomatic.    Check ESR, CRP, vitamin B12, folate, vitamin B1, TSH, copper, SPEP with IFE Nerve testing of the legs Continue gabapentin 300mg  TID.  OK to titrate as needed Patient educated on daily foot inspection, fall prevention, and safety precautions around the home.  Return to clinic in 4 months  ------------------------------------------------------------- History of present illness: Starting around the fall 2022, he began having cold sensation, numbness, and shooting pain in the toes.  Pain is intermittent.  Cold sensation and numbness is constant.  He wears winter socks year round to help with the cold feelings.  Resting feet on bare floor can aggravate the discomfort.   He takes gabapentin 300mg  three times daily which provides some relief and allows him to sleep. Balance is good.  No falls. He walks unassisted. He recalls having difficulty walking on sand in December 2022.  He underwent lumbar decompression at L4-5 in April and after noticing that his feet symptoms were unchanged, requested further evaluation.   He retired from [employer] x 37 years in Press [employer]. She drinks alcohol 2-3 times per week.   Out-side paper records, electronic medical record, and images have been reviewed where available and summarized as:  Lab Results  Component Value Date   ESRSEDRATE 22 (H) 07/08/2018    Past Medical History:  Diagnosis Date   Medical history non-contributory    Skin cysts, generalized     Past Surgical History:  Procedure Laterality Date   CATARACT EXTRACTION W/PHACO Left 05/31/2020   Procedure: CATARACT EXTRACTION PHACO AND INTRAOCULAR LENS PLACEMENT (IOC);  Surgeon: [name], MD;  Location: [facility];  Service: Ophthalmology;  Laterality: Left;  CDE: 8.26   CATARACT EXTRACTION W/PHACO Right 06/14/2020   Procedure: CATARACT EXTRACTION PHACO AND INTRAOCULAR LENS PLACEMENT RIGHT EYE;  Surgeon: [name], MD;  Location: [facility];  Service: Ophthalmology;  Laterality: Right;  CDE 10.91   JOINT REPLACEMENT     scope  rt knee   LUMBAR LAMINECTOMY/DECOMPRESSION MICRODISCECTOMY Left 12/24/2014   Procedure: LEFT LUMBAR FOUR-FIVE LUMBAR LAMINECTOMY/DECOMPRESSION MICRODISCECTOMY ;  Surgeon: Shirlean Kelly, MD;  Location: [facility];  Service: Neurosurgery;  Laterality: Left;  Left L45 laminotomy, extraforaminal exploration and microdiskectomy   MECKEL DIVERTICULUM EXCISION     ROTATOR CUFF REPAIR     sagill   spilt       Medications:  Outpatient Encounter  Medications as of 05/13/2022  Medication Sig   acetaminophen (TYLENOL) 500 MG tablet Take 1,000 mg by mouth every 6 (six) hours as needed.   gabapentin (NEURONTIN) 300 MG  capsule Take by mouth.   ibuprofen (ADVIL,MOTRIN) 200 MG tablet Take 400 mg by mouth every 6 (six) hours as needed for mild pain or moderate pain.   lidocaine (LMX) 4 % cream Apply 1 application. topically as needed (pain).   Multiple Vitamins-Minerals (MULTIVITAMIN WITH MINERALS) tablet Take 1 tablet by mouth daily.   naproxen sodium (ALEVE) 220 MG tablet Take 440 mg by mouth daily as needed (pain).   Omega-3 Fatty Acids (FISH OIL) 300 MG CAPS Take by mouth.   Polyethyl Glycol-Propyl Glycol (LUBRICATING EYE DROPS) 0.4-0.3 % SOLN Place 1 drop into both eyes as needed (dry eyes).   [DISCONTINUED] methocarbamol (ROBAXIN) 500 MG tablet Take 1 tablet (500 mg total) by mouth every 6 (six) hours as needed for muscle spasms. (Patient not taking: Reported on 05/13/2022)   No facility-administered encounter medications on file as of 05/13/2022.    Allergies:  Allergies  Allergen Reactions   Darvon [Propoxyphene] Nausea Only    Darvocet   Lexapro [Escitalopram Oxalate] Nausea Only    Family History: Family History  Problem Relation Age of Onset   Pneumonia Father    COPD Brother     Social History: Social History   Tobacco Use   Smoking status: Former    Packs/day: 1.00    Years: 38.00    Total pack years: 38.00    Types: Cigarettes   Smokeless tobacco: Never   Tobacco comments:    Quit 6 months ago  Vaping Use   Vaping Use: Never used  Substance Use Topics   Alcohol use: Yes    Alcohol/week: 6.0 standard drinks of alcohol    Types: 6 Cans of beer per week    Comment: Drinks socially   Drug use: Never   Social History   Social History Narrative   Are you right handed or left handed? Right Handed    Are you currently employed ? Retired for 9 years    What is your current occupation? Retired [employer] worked for 37 years    Do you live at home alone? No    Who lives with you? Lives with Wife    What type of home do you live in: 1 story or 2 story? Lives in a one  story home. One step into the [facility].        Vital Signs:  BP (!) 155/95   Pulse 82   Ht 6' (1.829 m)   Wt 184 lb (83.5 kg)   SpO2 96%   BMI 24.95 kg/m    Neurological Exam: MENTAL STATUS including orientation to time, place, person, recent and remote memory, attention span and concentration, language, and fund of knowledge is normal.  Speech is not dysarthric.  CRANIAL NERVES: II:  No visual field defects.   III-IV-VI: Pupils equal round and reactive to light.  Normal conjugate, extra-ocular eye movements in all directions of gaze.  No nystagmus.  No ptosis.   V:  Normal facial sensation.    VII:  Normal facial symmetry and movements.   VIII:  Normal hearing and vestibular function.   IX-X:  Normal palatal movement.   XI:  Normal shoulder shrug and head rotation.   XII:  Normal tongue strength and range of motion, no deviation or fasciculation.  MOTOR:  No atrophy, fasciculations  or abnormal movements.  No pronator drift.   Upper Extremity:  Right  Left  Deltoid  5/5   5/5   Biceps  5/5   5/5   Triceps  5/5   5/5   Wrist extensors  5/5   5/5   Wrist flexors  5/5   5/5   Finger extensors  5/5   5/5   Finger flexors  5/5   5/5   Dorsal interossei  5/5   5/5   Abductor pollicis  5/5   5/5   Tone (Ashworth scale)  0  0   Lower Extremity:  Right  Left  Hip flexors  5/5   5/5   Knee flexors  5/5   5/5   Knee extensors  5/5   5/5   Dorsiflexors  5/5   5/5   Plantarflexors  5/5   5/5   Toe extensors  5/5   5/5   Toe flexors  5/5   5/5   Tone (Ashworth scale)  0  0   MSRs:                                           Right        Left brachioradialis 2+  2+  biceps 2+  2+  triceps 2+  2+  patellar 2+  2+  ankle jerk 0  0  Hoffman no  no  plantar response down  down   SENSORY:  Vibration reduced to 40% at the ankles, absent at the great toe.  Temperature and pin prick reduced distal to mid-calf, hyperesthesia to pin prick in the feet.  Rhomberg testing is  positive.  COORDINATION/GAIT: Normal finger-to- nose-finger.  Intact rapid alternating movements bilaterally.   Gait narrow based and stable. Unable to perform tandem gait.  Stressed gait intact.     Thank you for allowing me to participate in patient's care.  If I can answer any additional questions, I would be pleased to do so.    Sincerely,    Burhanuddin Kohlmann K. Allena Katz, DO

## 2022-05-13 ENCOUNTER — Encounter: Payer: Self-pay | Admitting: Neurology

## 2022-05-13 ENCOUNTER — Other Ambulatory Visit (INDEPENDENT_AMBULATORY_CARE_PROVIDER_SITE_OTHER): Payer: Medicare Other

## 2022-05-13 ENCOUNTER — Ambulatory Visit (INDEPENDENT_AMBULATORY_CARE_PROVIDER_SITE_OTHER): Payer: Medicare Other | Admitting: Neurology

## 2022-05-13 VITALS — BP 155/95 | HR 82 | Ht 72.0 in | Wt 184.0 lb

## 2022-05-13 DIAGNOSIS — G629 Polyneuropathy, unspecified: Secondary | ICD-10-CM

## 2022-05-13 LAB — B12 AND FOLATE PANEL
Folate: 23.7 ng/mL (ref >5.9)
Vitamin B-12: 816 pg/mL (ref 211–911)

## 2022-05-13 LAB — C-REACTIVE PROTEIN: CRP: <1 mg/dL (ref 0.5–20.0)

## 2022-05-13 LAB — SEDIMENTATION RATE: Sed Rate: 9 mm/hr (ref 0–20)

## 2022-05-13 LAB — TSH: TSH: 1.55 u[IU]/mL (ref 0.35–5.50)

## 2022-05-13 NOTE — Patient Instructions (Signed)
Continue gabapentin 300mg  three times daily  Check labs  Nerve testing of the legs  ELECTROMYOGRAM AND NERVE CONDUCTION STUDIES (EMG/NCS) INSTRUCTIONS  How to Prepare The neurologist conducting the EMG will need to know if you have certain medical conditions. Tell the neurologist and other EMG lab personnel if you: Have a pacemaker or any other electrical medical device Take blood-thinning medications Have hemophilia, a blood-clotting disorder that causes prolonged bleeding Bathing Take a shower or bath shortly before your exam in order to remove oils from your skin. Don't apply lotions or creams before the exam.  What to Expect You'll likely be asked to change into a hospital gown for the procedure and lie down on an examination table. The following explanations can help you understand what will happen during the exam.  Electrodes. The neurologist or a technician places surface electrodes at various locations on your skin depending on where you're experiencing symptoms. Or the neurologist may insert needle electrodes at different sites depending on your symptoms.  Sensations. The electrodes will at times transmit a tiny electrical current that you may feel as a twinge or spasm. The needle electrode may cause discomfort or pain that usually ends shortly after the needle is removed. If you are concerned about discomfort or pain, you may want to talk to the neurologist about taking a short break during the exam.  Instructions. During the needle EMG, the neurologist will assess whether there is any spontaneous electrical activity when the muscle is at rest - activity that isn't present in healthy muscle tissue - and the degree of activity when you slightly contract the muscle.  He or she will give you instructions on resting and contracting a muscle at appropriate times. Depending on what muscles and nerves the neurologist is examining, he or she may ask you to change positions during the exam.   After your EMG You may experience some temporary, minor bruising where the needle electrode was inserted into your muscle. This bruising should fade within several days. If it persists, contact your primary care doctor.

## 2022-05-14 ENCOUNTER — Encounter: Payer: Medicare Other | Admitting: Neurology

## 2022-05-17 LAB — PROTEIN ELECTROPHORESIS, SERUM
Albumin ELP: 4.6 g/dL (ref 3.8–4.8)
Alpha 1: 0.2 g/dL (ref 0.2–0.3)
Alpha 2: 0.6 g/dL (ref 0.5–0.9)
Beta 2: 0.4 g/dL (ref 0.2–0.5)
Beta Globulin: 0.5 g/dL (ref 0.4–0.6)
Gamma Globulin: 1.5 g/dL (ref 0.8–1.7)
Total Protein: 7.8 g/dL (ref 6.1–8.1)

## 2022-05-17 LAB — VITAMIN B1: Vitamin B1 (Thiamine): 9 nmol/L (ref 8–30)

## 2022-05-17 LAB — IMMUNOFIXATION ELECTROPHORESIS
IgG (Immunoglobin G), Serum: 1526 mg/dL (ref 600–1540)
IgM, Serum: 230 mg/dL (ref 50–300)
Immunoglobulin A: 288 mg/dL (ref 70–320)

## 2022-05-17 LAB — COPPER, SERUM: Copper: 89 ug/dL (ref 70–175)

## 2022-05-18 ENCOUNTER — Other Ambulatory Visit: Payer: Self-pay

## 2022-05-18 ENCOUNTER — Telehealth: Payer: Self-pay

## 2022-05-18 NOTE — Telephone Encounter (Signed)
Pt called back and was informed of labs per Dr. Allena Katz.

## 2022-05-25 DIAGNOSIS — F17210 Nicotine dependence, cigarettes, uncomplicated: Secondary | ICD-10-CM | POA: Diagnosis not present

## 2022-05-25 DIAGNOSIS — Z299 Encounter for prophylactic measures, unspecified: Secondary | ICD-10-CM | POA: Diagnosis not present

## 2022-05-25 DIAGNOSIS — Z0000 Encounter for general adult medical examination without abnormal findings: Secondary | ICD-10-CM | POA: Diagnosis not present

## 2022-05-25 DIAGNOSIS — J302 Other seasonal allergic rhinitis: Secondary | ICD-10-CM | POA: Diagnosis not present

## 2022-05-25 DIAGNOSIS — G4700 Insomnia, unspecified: Secondary | ICD-10-CM | POA: Diagnosis not present

## 2022-05-28 ENCOUNTER — Ambulatory Visit (INDEPENDENT_AMBULATORY_CARE_PROVIDER_SITE_OTHER): Payer: Medicare Other | Admitting: Neurology

## 2022-05-28 DIAGNOSIS — G629 Polyneuropathy, unspecified: Secondary | ICD-10-CM | POA: Diagnosis not present

## 2022-05-28 NOTE — Procedures (Signed)
[facility]  [address] [place], [address]  [place], [place] [address] Tel: [phone] Fax:  [phone] Test Date:  05/28/2022  Patient: Douglas Henry DOB: Aug 17, 1955 Physician: Roxana Hires Tillie Viverette,DO  Sex: Male Height: 6' " Ref Phys: Nita Sickle, DO  ID#: [id number]   Technician:    Patient Complaints: This is a 66 year old man  s/p L4-5 laminectomy and decompression referred for evaluation of bilateral feet paresthesias.  NCV & EMG Findings: Extensive electrodiagnostic testing of the right lower extremity and additional studies of the left shows:  Bilateral superficial peroneal sensory responses are absent.  Sural sensory response on the right is normal and borderline-normal on the left.  Bilateral peroneal motor responses at the extensor digitorum brevis are absent, and normal at the tibialis anterior.  Bilateral tibial motor responses show reduced amplitude (R1.8, L2.2 mV). Bilateral tibial H reflex studies show prolonged latency. Chronic motor axon loss changes are seen affecting the L5 myotome bilaterally, without accompanying active denervation.  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 There is superimposed L5 radiculopathy affecting bilateral lower extremities, mild.   ___________________________ Roxana Hires Audelia Knape,DO    Nerve Conduction Studies Anti Sensory Summary Table   Stim Site NR Peak (ms) Norm Peak (ms) O-P Amp (V) Norm O-P Amp  Left Sup Peroneal Anti Sensory (Ant Lat Mall)  34C  12 cm NR  <4.6  >3  Right Sup Peroneal Anti Sensory (Ant Lat Mall)  34C  12 cm NR  <4.6  >3  Left Sural Anti Sensory (Lat Mall)  34C  Calf    3.8 <4.6 3.0 >3  Right Sural Anti Sensory (Lat Mall)  34C  Calf    3.5 <4.6 5.9 >3   Motor Summary Table   Stim Site NR Onset (ms) Norm Onset (ms) O-P Amp (mV) Norm O-P Amp Site1 Site2 Delta-0 (ms) Dist (cm) Vel (m/s) Norm Vel (m/s)   Left Peroneal Motor (Ext Dig Brev)  34C  Ankle NR  <6.0  >2.5 B Fib Ankle  0.0  >40  B Fib NR     Poplt B Fib  0.0  >40  Poplt NR            Right Peroneal Motor (Ext Dig Brev)  34C  Ankle NR  <6.0  >2.5 B Fib Ankle  39.0  >40  B Fib NR     Poplt B Fib  9.0  >40  Poplt NR            Left Peroneal TA Motor (Tib Ant)  34C  Fib Head    3.0 <4.5 3.3 >3 Poplit Fib Head 1.7 7.0 41 >40  Poplit    4.7  2.7         Right Peroneal TA Motor (Tib Ant)  34C  Fib Head    3.1 <4.5 3.4 >3 Poplit Fib Head 1.7 7.0 41 >40  Poplit    4.8  3.3         Left Tibial Motor (Abd Hall Brev)  34C  Ankle    4.4 <6.0 2.2 >4 Knee Ankle 10.4 42.0 40 >40  Knee    14.8  1.5         Right Tibial Motor (Abd Hall Brev)  34C  Ankle    4.3 <6.0 1.8 >4 Knee Ankle 11.0 45.0 41 >40  Knee    15.3  1.4          H Reflex Studies  NR H-Lat (ms) Lat Norm (ms) L-R H-Lat (ms)  Left Tibial (Gastroc)  34C     38.50 <35 0.95  Right Tibial (Gastroc)  34C     39.46 <35 0.95   EMG   Side Muscle Ins Act Fibs Fasc Recrt Dur. Amp. Poly. Activation Comment  Right RectFemoris Nml Nml Nml Nml Nml Nml Nml Nml N/A  Right Gastroc Nml Nml Nml Nml Nml Nml Nml Nml N/A  Right Flex Dig Long Nml Nml Nml 2- 1+ 1+ 1+ Nml N/A  Right GluteusMed Nml Nml Nml 1- 1+ 1+ 1+ Nml N/A  Right AntTibialis Nml Nml Nml 1- 1+ 1+ 1+ Nml N/A  Left Gastroc Nml Nml Nml Nml Nml Nml Nml Nml N/A  Left RectFemoris Nml Nml Nml Nml Nml Nml Nml Nml N/A  Left GluteusMed Nml Nml Nml 1- 1+ 1+ 1+ Nml N/A  Left AntTibialis Nml Nml Nml 1- 1+ 1+ 1+ Nml N/A  Left Flex Dig Long Nml Nml Nml 2- 1+ 1+ 1+ Nml N/A      Waveforms:

## 2022-06-01 ENCOUNTER — Telehealth: Payer: Self-pay | Admitting: Anesthesiology

## 2022-06-01 NOTE — Telephone Encounter (Signed)
Pt called stating he would like to know the results of the EMG.

## 2022-06-02 NOTE — Telephone Encounter (Signed)
Pt called back in returning Renee's call about EMG results

## 2022-06-02 NOTE — Telephone Encounter (Signed)
Please let pt know that nerve testing confirms the presence of neuropathy in the feet.  There is also findings of L5 radiculopathy in both legs, which is old.  Unfortunately, there is nothing to cure neuropathy.  Continue gabapentin and follow-up as scheduled.  Thanks.

## 2022-06-03 NOTE — Telephone Encounter (Signed)
Called patient and informed him of results and recommendations of nerve testing. Patient verbalized understanding and will see [place] at his next f/u.

## 2022-09-15 ENCOUNTER — Encounter: Payer: Self-pay | Admitting: Neurology

## 2022-09-15 ENCOUNTER — Ambulatory Visit (INDEPENDENT_AMBULATORY_CARE_PROVIDER_SITE_OTHER): Payer: Medicare Other | Admitting: Neurology

## 2022-09-15 VITALS — BP 124/78 | HR 75 | Ht 72.0 in | Wt 178.0 lb

## 2022-09-15 DIAGNOSIS — G629 Polyneuropathy, unspecified: Secondary | ICD-10-CM

## 2022-09-15 MED ORDER — GABAPENTIN 300 MG PO CAPS: ORAL_CAPSULE | ORAL | 1 refills | Status: DC

## 2022-09-15 NOTE — Patient Instructions (Addendum)
Increase gabapentin to 300mg  in the morning and 900mg  at bedtime Continue home balance exercises I will see you back in 6 months

## 2022-09-15 NOTE — Progress Notes (Signed)
Follow-up Visit   Date: 09/15/2022    Douglas Henry MRN: [id number] DOB: [birth year]    Douglas Henry is a 67 y.o. right-handed Caucasian male with s/p L4-5 laminectomy and decompression (09/2021) and prior tobacco use returning to the clinic for follow-up of neuropathy.  The patient was accompanied to the clinic by self.  IMPRESSION/PLAN: Peripheral neuropathy affecting the feet, idiopathic. 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  He continues to have painful paresthesias  - Increase gabapentin to 300mg  in the morning and 900mg  at bedtime  - Continue home balance exercises  - Patient educated on daily foot inspection, fall prevention, and safety precautions around the home.  2.  Chronic L5 radiculopathy s/p L4-5 laminectomy and decompression in April 2023 by Dr. Kathyrn Sheriff    Return to clinic in 6 months  --------------------------------------------- History of present illness: Starting around the fall 2022, he began having cold sensation, numbness, and shooting pain in the toes.  Pain is intermittent.  Cold sensation and numbness is constant.  He wears winter socks year round to help with the cold feelings.  Resting feet on bare floor can aggravate the discomfort.  He takes gabapentin 300mg  three times daily which provides some relief and allows him to sleep. Balance is good.  No falls. He walks unassisted. He recalls having difficulty walking on sand in December 2022.  He underwent lumbar decompression at L4-5 in April and after noticing that his feet symptoms were unchanged, requested further evaluation.    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 he drinks alcohol 2-3 times per week.  UPDATE 09/15/2022:  He is here for follow-up visit.  EMG confirmed the presence of a large fiber sensorimotor polyneuropathy affecting the feet as well as mild L5 radiculopathy bilaterally.   Neuropathy labs returned normal.  He continues to have numbness and shooting pain in the legs and feet. He takes gabapentin 300mg  in the morning and 600mg  at bedtime.  Sometimes, he continues to have severe electrical pain in the legs and he takes tylenol.  Balance is fair.  He walks unassisted, but noticed imbalance especially on uneven ground.  No falls.    Medications:  Current Outpatient Medications on File Prior to Visit  Medication Sig Dispense Refill   acetaminophen (TYLENOL) 500 MG tablet Take 1,000 mg by mouth every 6 (six) hours as needed.     gabapentin (NEURONTIN) 300 MG capsule Take 300 mg by mouth 3 (three) times daily.     ibuprofen (ADVIL,MOTRIN) 200 MG tablet Take 400 mg by mouth every 6 (six) hours as needed for mild pain or moderate pain.     lidocaine (LMX) 4 % cream Apply 1 application. topically as needed (pain).     Multiple Vitamins-Minerals (MULTIVITAMIN WITH MINERALS) tablet Take 1 tablet by mouth daily.     naproxen sodium (ALEVE) 220 MG tablet Take 440 mg by mouth daily as needed (pain).     Omega-3 Fatty Acids (FISH OIL) 300 MG CAPS Take by mouth.     Polyethyl Glycol-Propyl Glycol (LUBRICATING EYE DROPS) 0.4-0.3 % SOLN Place 1 drop into both eyes as needed (dry eyes).     No current facility-administered medications on file prior to visit.    Allergies:  Allergies  Allergen Reactions   Darvon [Propoxyphene] Nausea Only    Darvocet   Lexapro [Escitalopram Oxalate] Nausea Only    Vital Signs:  BP 124/78   Pulse  75   Ht 6' (1.829 m)   Wt 178 lb (80.7 kg)   SpO2 96%   BMI 24.14 kg/m    Neurological Exam: MENTAL STATUS including orientation to time, place, person, recent and remote memory, attention span and concentration, language, and fund of knowledge is normal.  Speech is not dysarthric.  CRANIAL NERVES:  Pupils equal round and reactive to light.  Normal conjugate, extra-ocular eye movements in all directions of gaze.  No ptosis.  Face is symmetric.    MOTOR:  Motor strength is 5/5 in all extremities.  No atrophy, fasciculations or abnormal movements.  No pronator drift.  Tone is normal.    MSRs:  Reflexes are 2+/4 throughout, except absent at the ankles bilaterally.  SENSORY:  Vibration reduced at the ankles and absent at the great toe bilaterally.  Temperature reduced from mid-calf distally.  Rhomberg testing is positive.   COORDINATION/GAIT:  Normal finger-to- nose-finger.  Intact rapid alternating movements bilaterally.  Gait narrow based and stable.  Unable to perform stressed gait.  Stressed gait intact.   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 There is superimposed L5 radiculopathy affecting bilateral lower extremities, mild.   Thank you for allowing me to participate in patient's care.  If I can answer any additional questions, I would be pleased to do so.    Sincerely,    Carri Spillers K. Posey Pronto, DO

## 2022-12-10 DIAGNOSIS — Z299 Encounter for prophylactic measures, unspecified: Secondary | ICD-10-CM | POA: Diagnosis not present

## 2022-12-10 DIAGNOSIS — Z7189 Other specified counseling: Secondary | ICD-10-CM | POA: Diagnosis not present

## 2022-12-10 DIAGNOSIS — Z0000 Encounter for general adult medical examination without abnormal findings: Secondary | ICD-10-CM | POA: Diagnosis not present

## 2022-12-10 DIAGNOSIS — R5383 Other fatigue: Secondary | ICD-10-CM | POA: Diagnosis not present

## 2022-12-10 DIAGNOSIS — Z79899 Other long term (current) drug therapy: Secondary | ICD-10-CM | POA: Diagnosis not present

## 2022-12-11 DIAGNOSIS — Z79899 Other long term (current) drug therapy: Secondary | ICD-10-CM | POA: Diagnosis not present

## 2022-12-11 DIAGNOSIS — R5383 Other fatigue: Secondary | ICD-10-CM | POA: Diagnosis not present

## 2022-12-23 ENCOUNTER — Encounter: Payer: Self-pay | Admitting: Neurology

## 2023-03-16 ENCOUNTER — Ambulatory Visit: Payer: Medicare Other | Admitting: Neurology

## 2023-03-23 ENCOUNTER — Encounter: Payer: Self-pay | Admitting: Neurology

## 2023-03-23 ENCOUNTER — Ambulatory Visit (INDEPENDENT_AMBULATORY_CARE_PROVIDER_SITE_OTHER): Payer: Medicare Other | Admitting: Neurology

## 2023-03-23 VITALS — BP 135/88 | HR 92 | Ht 72.0 in | Wt 183.0 lb

## 2023-03-23 DIAGNOSIS — R2681 Unsteadiness on feet: Secondary | ICD-10-CM | POA: Diagnosis not present

## 2023-03-23 DIAGNOSIS — G629 Polyneuropathy, unspecified: Secondary | ICD-10-CM

## 2023-03-23 MED ORDER — GABAPENTIN 300 MG PO CAPS: ORAL_CAPSULE | ORAL | 1 refills | Status: DC

## 2023-03-23 NOTE — Progress Notes (Signed)
Follow-up Visit   Date: 03/23/2023    TAHER WILCH MRN: [id number] DOB: 1956-04-05    Douglas Henry is a 67 y.o. right-handed Caucasian male with s/p L4-5 laminectomy and decompression (09/2021) and prior tobacco use returning to the clinic for follow-up of neuropathy.  The patient was accompanied to the clinic by self.  IMPRESSION/PLAN: Peripheral neuropathy affecting the feet, idiopathic.   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   - Continue gabapentin to 300mg  in the morning and 900mg  at bedtime  - Start physical therapy for balance training  - Patient educated on daily foot inspection, fall prevention, and safety precautions around the home.  2.  Chronic L5 radiculopathy s/p L4-5 laminectomy and decompression in April 2023 by Dr. Conchita Paris  3.  Follow-up with PCP re: shortness of breath and joint pain   Return to clinic in 6 months  --------------------------------------------- History of present illness: Starting around the fall 2022, he began having cold sensation, numbness, and shooting pain in the toes.  Pain is intermittent.  Cold sensation and numbness is constant.  He wears winter socks year round to help with the cold feelings.  Resting feet on bare floor can aggravate the discomfort.  He takes gabapentin 300mg  three times daily which provides some relief and allows him to sleep. Balance is good.  No falls. He walks unassisted. He recalls having difficulty walking on sand in December 2022.  He underwent lumbar decompression at L4-5 in April and after noticing that his feet symptoms were unchanged, requested further evaluation.    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  UPDATE 09/15/2022:  He is here for follow-up visit.  EMG confirmed the presence of a large fiber sensorimotor polyneuropathy affecting the feet as well as mild L5  radiculopathy bilaterally.  Neuropathy labs returned normal.  He continues to have numbness and shooting pain in the legs and feet. He takes gabapentin 300mg  in the morning and 600mg  at bedtime.  Sometimes, he continues to have severe electrical pain in the legs and he takes tylenol.  Balance is fair.  He walks unassisted, but noticed imbalance especially on uneven ground.  No falls.    UPDATE 03/23/2023:  He is here for follow-up visit.  At his last visit, gabapentin was increased to 1200mg /d.  He has not noticed a significant change. He continues to have painful tingling in the feet and spells of severe stinging pain in the feet, which usually occurs when he is sitting in his recliner.  Pain is improved with repositioning.  His balance is getting worse and had one fall in the yard.  He did not injure himself.    He is also complaining of shortness of breath and generalized joint pain. He has not seen his PCP for this.   Medications:  Current Outpatient Medications on File Prior to Visit  Medication Sig Dispense Refill   acetaminophen (TYLENOL) 500 MG tablet Take 1,000 mg by mouth every 6 (six) hours as needed.     gabapentin (NEURONTIN) 300 MG capsule Take 1 tablet in the morning 3 tablets at bedtime. 360 capsule 1   ibuprofen (ADVIL,MOTRIN) 200 MG tablet Take 400 mg by mouth every 6 (six) hours as needed for mild pain or moderate pain.     lidocaine (LMX) 4 % cream Apply 1 application. topically as needed (pain).     Multiple Vitamins-Minerals (  MULTIVITAMIN WITH MINERALS) tablet Take 1 tablet by mouth daily.     naproxen sodium (ALEVE) 220 MG tablet Take 440 mg by mouth daily as needed (pain).     Omega-3 Fatty Acids (FISH OIL) 300 MG CAPS Take by mouth.     Polyethyl Glycol-Propyl Glycol (LUBRICATING EYE DROPS) 0.4-0.3 % SOLN Place 1 drop into both eyes as needed (dry eyes).     No current facility-administered medications on file prior to visit.    Allergies:  Allergies  Allergen Reactions    Darvon [Propoxyphene] Nausea Only    Darvocet   Lexapro [Escitalopram Oxalate] Nausea Only    Vital Signs:  BP 135/88   Pulse 92   Ht 6' (1.829 m)   Wt 183 lb (83 kg)   SpO2 97%   BMI 24.82 kg/m    Neurological Exam: MENTAL STATUS including orientation to time, place, person, recent and remote memory, attention span and concentration, language, and fund of knowledge is normal.  Speech is not dysarthric.  CRANIAL NERVES:  Pupils equal round and reactive to light.  Normal conjugate, extra-ocular eye movements in all directions of gaze.  No ptosis.  Face is symmetric.   MOTOR:  Motor strength is 5/5 in all extremities.  No atrophy, fasciculations or abnormal movements.  No pronator drift.  Tone is normal.    MSRs:  Reflexes are 2+/4 throughout, except trace at the ankles bilaterally.  SENSORY:  Vibration reduced at the ankles Temperature reduced from mid-calf distally.  Rhomberg testing is positive.   COORDINATION/GAIT:  Normal finger-to- nose-finger.  Intact rapid alternating movements bilaterally.  Gait narrow based and stable.  Unable to perform stressed gait.  Stressed gait intact.   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 There is superimposed L5 radiculopathy affecting bilateral lower extremities, mild.   Thank you for allowing me to participate in patient's care.  If I can answer any additional questions, I would be pleased to do so.    Sincerely,    Oumou Smead K. Allena Katz, DO

## 2023-03-23 NOTE — Patient Instructions (Addendum)
Start physical therapy for balance  Continue gabapentin to 300mg  in the morning and 900mg  at bedtime  Please follow-up with your primary care doctor for your shortness of breath and joint pain

## 2023-04-01 DIAGNOSIS — M25871 Other specified joint disorders, right ankle and foot: Secondary | ICD-10-CM | POA: Diagnosis not present

## 2023-04-01 DIAGNOSIS — M25872 Other specified joint disorders, left ankle and foot: Secondary | ICD-10-CM | POA: Diagnosis not present

## 2023-04-06 DIAGNOSIS — M25871 Other specified joint disorders, right ankle and foot: Secondary | ICD-10-CM | POA: Diagnosis not present

## 2023-04-06 DIAGNOSIS — M25872 Other specified joint disorders, left ankle and foot: Secondary | ICD-10-CM | POA: Diagnosis not present

## 2023-04-08 DIAGNOSIS — M25872 Other specified joint disorders, left ankle and foot: Secondary | ICD-10-CM | POA: Diagnosis not present

## 2023-04-08 DIAGNOSIS — M25871 Other specified joint disorders, right ankle and foot: Secondary | ICD-10-CM | POA: Diagnosis not present

## 2023-04-23 DIAGNOSIS — M25871 Other specified joint disorders, right ankle and foot: Secondary | ICD-10-CM | POA: Diagnosis not present

## 2023-04-23 DIAGNOSIS — M25872 Other specified joint disorders, left ankle and foot: Secondary | ICD-10-CM | POA: Diagnosis not present

## 2023-04-27 DIAGNOSIS — M25872 Other specified joint disorders, left ankle and foot: Secondary | ICD-10-CM | POA: Diagnosis not present

## 2023-04-27 DIAGNOSIS — M25871 Other specified joint disorders, right ankle and foot: Secondary | ICD-10-CM | POA: Diagnosis not present

## 2023-04-28 DIAGNOSIS — M25871 Other specified joint disorders, right ankle and foot: Secondary | ICD-10-CM | POA: Diagnosis not present

## 2023-04-28 DIAGNOSIS — M25872 Other specified joint disorders, left ankle and foot: Secondary | ICD-10-CM | POA: Diagnosis not present

## 2023-05-04 DIAGNOSIS — M25871 Other specified joint disorders, right ankle and foot: Secondary | ICD-10-CM | POA: Diagnosis not present

## 2023-05-04 DIAGNOSIS — M25872 Other specified joint disorders, left ankle and foot: Secondary | ICD-10-CM | POA: Diagnosis not present

## 2023-05-06 DIAGNOSIS — M25871 Other specified joint disorders, right ankle and foot: Secondary | ICD-10-CM | POA: Diagnosis not present

## 2023-05-06 DIAGNOSIS — M25872 Other specified joint disorders, left ankle and foot: Secondary | ICD-10-CM | POA: Diagnosis not present

## 2023-05-11 DIAGNOSIS — M25871 Other specified joint disorders, right ankle and foot: Secondary | ICD-10-CM | POA: Diagnosis not present

## 2023-05-11 DIAGNOSIS — M25872 Other specified joint disorders, left ankle and foot: Secondary | ICD-10-CM | POA: Diagnosis not present

## 2023-06-01 DIAGNOSIS — M1990 Unspecified osteoarthritis, unspecified site: Secondary | ICD-10-CM | POA: Diagnosis not present

## 2023-06-01 DIAGNOSIS — Z299 Encounter for prophylactic measures, unspecified: Secondary | ICD-10-CM | POA: Diagnosis not present

## 2023-06-01 DIAGNOSIS — Z0000 Encounter for general adult medical examination without abnormal findings: Secondary | ICD-10-CM | POA: Diagnosis not present

## 2023-06-01 DIAGNOSIS — M545 Low back pain, unspecified: Secondary | ICD-10-CM | POA: Diagnosis not present

## 2023-07-08 DIAGNOSIS — M25512 Pain in left shoulder: Secondary | ICD-10-CM | POA: Diagnosis not present

## 2023-07-08 DIAGNOSIS — M19012 Primary osteoarthritis, left shoulder: Secondary | ICD-10-CM | POA: Diagnosis not present

## 2023-07-08 DIAGNOSIS — Z299 Encounter for prophylactic measures, unspecified: Secondary | ICD-10-CM | POA: Diagnosis not present

## 2023-09-27 ENCOUNTER — Ambulatory Visit: Payer: Medicare Other | Admitting: Neurology

## 2023-10-12 ENCOUNTER — Encounter: Payer: Self-pay | Admitting: Neurology

## 2023-10-12 ENCOUNTER — Ambulatory Visit (INDEPENDENT_AMBULATORY_CARE_PROVIDER_SITE_OTHER): Admitting: Neurology

## 2023-10-12 VITALS — BP 123/85 | HR 97 | Wt 175.0 lb

## 2023-10-12 DIAGNOSIS — R2681 Unsteadiness on feet: Secondary | ICD-10-CM | POA: Diagnosis not present

## 2023-10-12 DIAGNOSIS — G629 Polyneuropathy, unspecified: Secondary | ICD-10-CM | POA: Diagnosis not present

## 2023-10-12 MED ORDER — GABAPENTIN 300 MG PO CAPS: ORAL_CAPSULE | ORAL | 1 refills | Status: DC

## 2023-10-12 NOTE — Progress Notes (Signed)
 Follow-up Visit   Date: 10/12/2023    Douglas Henry MRN: [id number] DOB: 11/29/1955    Douglas Henry is a 68 y.o. right-handed Caucasian male with s/p L4-5 laminectomy and decompression (09/2021) and prior tobacco use returning to the clinic for follow-up of neuropathy.  The patient was accompanied to the clinic by self.  IMPRESSION/PLAN: Peripheral neuropathy affecting the feet, idiopathic. Stable.  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 He would like to taper gabapentin, but I suggest that we consider making this change at a later time because symptoms are controlled and I do not want to make his pain worse while he is greiving.   - Continue gabapentin 300mg  in the morning and 900mg  at bedtime, refilled.    - Continue home exercises  - Patient educated on daily foot inspection, fall prevention, and safety precautions around the home.  2.  Chronic L5 radiculopathy s/p L4-5 laminectomy and decompression in April 2023 by Dr. Conchita Paris    Return to clinic in 6 months  --------------------------------------------- History of present illness: Starting around the fall 2022, he began having cold sensation, numbness, and shooting pain in the toes.  Pain is intermittent.  Cold sensation and numbness is constant.  He wears winter socks year round to help with the cold feelings.  Resting feet on bare floor can aggravate the discomfort.  He takes gabapentin 300mg  three times daily which provides some relief and allows him to sleep. Balance is good.  No falls. He walks unassisted. He recalls having difficulty walking on sand in December 2022.  He underwent lumbar decompression at L4-5 in April and after noticing that his feet symptoms were unchanged, requested further evaluation.    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  UPDATE 09/15/2022:  He is here  for follow-up visit.  EMG confirmed the presence of a large fiber sensorimotor polyneuropathy affecting the feet as well as mild L5 radiculopathy bilaterally.  Neuropathy labs returned normal.  He continues to have numbness and shooting pain in the legs and feet. He takes gabapentin 300mg  in the morning and 600mg  at bedtime.  Sometimes, he continues to have severe electrical pain in the legs and he takes tylenol.  Balance is fair.  He walks unassisted, but noticed imbalance especially on uneven ground.  No falls.    UPDATE 03/23/2023:  He is here for follow-up visit.  At his last visit, gabapentin was increased to 1200mg /d.  He has not noticed a significant change. He continues to have painful tingling in the feet and spells of severe stinging pain in the feet, which usually occurs when he is sitting in his recliner.  Pain is improved with repositioning.  His balance is getting worse and had one fall in the yard.  He did not injure himself.    He is also complaining of shortness of breath and generalized joint pain. He has not seen his PCP for this.   UPDATE 10/12/2023:  He is here for follow-up visit.  He has noticed mild progression of numbness in the legs.  He is very cautious with walking and as long as he is able to see the ground, his does well walking.  Uneven ground makes him much more unsteady.  He was doing yard work last week and had a few falls.  He is very tearful today because his son passed away suddenly last month.  He is mourning his loss.   Medications:  Current Outpatient Medications on File Prior to Visit  Medication Sig Dispense Refill   acetaminophen (TYLENOL) 500 MG tablet Take 1,000 mg by mouth every 6 (six) hours as needed.     ibuprofen (ADVIL,MOTRIN) 200 MG tablet Take 400 mg by mouth every 6 (six) hours as needed for mild pain or moderate pain.     lidocaine (LMX) 4 % cream Apply 1 application. topically as needed (pain).     Multiple Vitamins-Minerals (MULTIVITAMIN WITH  MINERALS) tablet Take 1 tablet by mouth daily.     naproxen sodium (ALEVE) 220 MG tablet Take 440 mg by mouth daily as needed (pain).     Omega-3 Fatty Acids (FISH OIL) 300 MG CAPS Take by mouth.     Polyethyl Glycol-Propyl Glycol (LUBRICATING EYE DROPS) 0.4-0.3 % SOLN Place 1 drop into both eyes as needed (dry eyes).     No current facility-administered medications on file prior to visit.    Allergies:  Allergies  Allergen Reactions   Darvon [Propoxyphene] Nausea Only    Darvocet   Lexapro [Escitalopram Oxalate] Nausea Only    Vital Signs:  BP 123/85   Pulse 97   Wt 175 lb (79.4 kg)   SpO2 93%   BMI 23.73 kg/m    Neurological Exam: MENTAL STATUS including orientation to time, place, person, recent and remote memory, attention span and concentration, language, and fund of knowledge is normal.  Speech is not dysarthric.  CRANIAL NERVES:  Pupils equal round and reactive to light.  Normal conjugate, extra-ocular eye movements in all directions of gaze.  No ptosis.  Face is symmetric.   MOTOR:  Motor strength is 5/5 in all extremities.  No atrophy, fasciculations or abnormal movements.  No pronator drift.  Tone is normal.    MSRs:  Reflexes are 2+/4 throughout, except trace at the ankles bilaterally.  SENSORY:  Vibration reduced at the ankles. Temperature reduced from mid-calf distally.  Rhomberg testing is positive.   COORDINATION/GAIT:  Gait narrow based and stable.    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 There is superimposed L5 radiculopathy affecting bilateral lower extremities, mild.   Thank you for allowing me to participate in patient's care.  If I can answer any additional questions, I would be pleased to do so.    Sincerely,    Liseth Wann K. Lydia Sams, DO

## 2023-12-22 DIAGNOSIS — Z7189 Other specified counseling: Secondary | ICD-10-CM | POA: Diagnosis not present

## 2023-12-22 DIAGNOSIS — R5383 Other fatigue: Secondary | ICD-10-CM | POA: Diagnosis not present

## 2023-12-22 DIAGNOSIS — M545 Low back pain, unspecified: Secondary | ICD-10-CM | POA: Diagnosis not present

## 2023-12-22 DIAGNOSIS — Z299 Encounter for prophylactic measures, unspecified: Secondary | ICD-10-CM | POA: Diagnosis not present

## 2023-12-22 DIAGNOSIS — M1990 Unspecified osteoarthritis, unspecified site: Secondary | ICD-10-CM | POA: Diagnosis not present

## 2023-12-22 DIAGNOSIS — F17210 Nicotine dependence, cigarettes, uncomplicated: Secondary | ICD-10-CM | POA: Diagnosis not present

## 2023-12-22 DIAGNOSIS — Z0000 Encounter for general adult medical examination without abnormal findings: Secondary | ICD-10-CM | POA: Diagnosis not present

## 2023-12-22 DIAGNOSIS — Z1389 Encounter for screening for other disorder: Secondary | ICD-10-CM | POA: Diagnosis not present

## 2024-01-03 DIAGNOSIS — R5383 Other fatigue: Secondary | ICD-10-CM | POA: Diagnosis not present

## 2024-01-03 DIAGNOSIS — M1990 Unspecified osteoarthritis, unspecified site: Secondary | ICD-10-CM | POA: Diagnosis not present

## 2024-01-03 DIAGNOSIS — E7800 Pure hypercholesterolemia, unspecified: Secondary | ICD-10-CM | POA: Diagnosis not present

## 2024-01-03 DIAGNOSIS — Z79899 Other long term (current) drug therapy: Secondary | ICD-10-CM | POA: Diagnosis not present

## 2024-01-21 DIAGNOSIS — M1990 Unspecified osteoarthritis, unspecified site: Secondary | ICD-10-CM | POA: Diagnosis not present

## 2024-01-21 DIAGNOSIS — R52 Pain, unspecified: Secondary | ICD-10-CM | POA: Diagnosis not present

## 2024-01-21 DIAGNOSIS — M25512 Pain in left shoulder: Secondary | ICD-10-CM | POA: Diagnosis not present

## 2024-01-21 DIAGNOSIS — Z299 Encounter for prophylactic measures, unspecified: Secondary | ICD-10-CM | POA: Diagnosis not present

## 2024-01-21 DIAGNOSIS — R739 Hyperglycemia, unspecified: Secondary | ICD-10-CM | POA: Diagnosis not present

## 2024-01-31 DIAGNOSIS — M25512 Pain in left shoulder: Secondary | ICD-10-CM | POA: Diagnosis not present

## 2024-01-31 DIAGNOSIS — R293 Abnormal posture: Secondary | ICD-10-CM | POA: Diagnosis not present

## 2024-01-31 DIAGNOSIS — M6281 Muscle weakness (generalized): Secondary | ICD-10-CM | POA: Diagnosis not present

## 2024-01-31 DIAGNOSIS — M25612 Stiffness of left shoulder, not elsewhere classified: Secondary | ICD-10-CM | POA: Diagnosis not present

## 2024-02-02 DIAGNOSIS — R293 Abnormal posture: Secondary | ICD-10-CM | POA: Diagnosis not present

## 2024-02-02 DIAGNOSIS — M25512 Pain in left shoulder: Secondary | ICD-10-CM | POA: Diagnosis not present

## 2024-02-02 DIAGNOSIS — M25612 Stiffness of left shoulder, not elsewhere classified: Secondary | ICD-10-CM | POA: Diagnosis not present

## 2024-02-02 DIAGNOSIS — M6281 Muscle weakness (generalized): Secondary | ICD-10-CM | POA: Diagnosis not present

## 2024-02-04 DIAGNOSIS — Z299 Encounter for prophylactic measures, unspecified: Secondary | ICD-10-CM | POA: Diagnosis not present

## 2024-02-04 DIAGNOSIS — R7303 Prediabetes: Secondary | ICD-10-CM | POA: Diagnosis not present

## 2024-02-04 DIAGNOSIS — E7800 Pure hypercholesterolemia, unspecified: Secondary | ICD-10-CM | POA: Diagnosis not present

## 2024-02-09 DIAGNOSIS — M25512 Pain in left shoulder: Secondary | ICD-10-CM | POA: Diagnosis not present

## 2024-02-09 DIAGNOSIS — M25612 Stiffness of left shoulder, not elsewhere classified: Secondary | ICD-10-CM | POA: Diagnosis not present

## 2024-02-09 DIAGNOSIS — M6281 Muscle weakness (generalized): Secondary | ICD-10-CM | POA: Diagnosis not present

## 2024-02-09 DIAGNOSIS — R293 Abnormal posture: Secondary | ICD-10-CM | POA: Diagnosis not present

## 2024-02-16 DIAGNOSIS — M25612 Stiffness of left shoulder, not elsewhere classified: Secondary | ICD-10-CM | POA: Diagnosis not present

## 2024-02-16 DIAGNOSIS — M25512 Pain in left shoulder: Secondary | ICD-10-CM | POA: Diagnosis not present

## 2024-02-16 DIAGNOSIS — M6281 Muscle weakness (generalized): Secondary | ICD-10-CM | POA: Diagnosis not present

## 2024-02-16 DIAGNOSIS — R293 Abnormal posture: Secondary | ICD-10-CM | POA: Diagnosis not present

## 2024-02-21 DIAGNOSIS — M6281 Muscle weakness (generalized): Secondary | ICD-10-CM | POA: Diagnosis not present

## 2024-02-21 DIAGNOSIS — M25512 Pain in left shoulder: Secondary | ICD-10-CM | POA: Diagnosis not present

## 2024-02-21 DIAGNOSIS — R293 Abnormal posture: Secondary | ICD-10-CM | POA: Diagnosis not present

## 2024-02-21 DIAGNOSIS — M25612 Stiffness of left shoulder, not elsewhere classified: Secondary | ICD-10-CM | POA: Diagnosis not present

## 2024-02-23 DIAGNOSIS — M6281 Muscle weakness (generalized): Secondary | ICD-10-CM | POA: Diagnosis not present

## 2024-02-23 DIAGNOSIS — M25612 Stiffness of left shoulder, not elsewhere classified: Secondary | ICD-10-CM | POA: Diagnosis not present

## 2024-02-23 DIAGNOSIS — R293 Abnormal posture: Secondary | ICD-10-CM | POA: Diagnosis not present

## 2024-02-23 DIAGNOSIS — M25512 Pain in left shoulder: Secondary | ICD-10-CM | POA: Diagnosis not present

## 2024-03-02 DIAGNOSIS — R293 Abnormal posture: Secondary | ICD-10-CM | POA: Diagnosis not present

## 2024-03-02 DIAGNOSIS — M25612 Stiffness of left shoulder, not elsewhere classified: Secondary | ICD-10-CM | POA: Diagnosis not present

## 2024-03-02 DIAGNOSIS — M25512 Pain in left shoulder: Secondary | ICD-10-CM | POA: Diagnosis not present

## 2024-03-02 DIAGNOSIS — M6281 Muscle weakness (generalized): Secondary | ICD-10-CM | POA: Diagnosis not present

## 2024-03-07 DIAGNOSIS — M6281 Muscle weakness (generalized): Secondary | ICD-10-CM | POA: Diagnosis not present

## 2024-03-07 DIAGNOSIS — R293 Abnormal posture: Secondary | ICD-10-CM | POA: Diagnosis not present

## 2024-03-07 DIAGNOSIS — M25512 Pain in left shoulder: Secondary | ICD-10-CM | POA: Diagnosis not present

## 2024-03-07 DIAGNOSIS — M25612 Stiffness of left shoulder, not elsewhere classified: Secondary | ICD-10-CM | POA: Diagnosis not present

## 2024-03-13 DIAGNOSIS — M25512 Pain in left shoulder: Secondary | ICD-10-CM | POA: Diagnosis not present

## 2024-03-13 DIAGNOSIS — M25612 Stiffness of left shoulder, not elsewhere classified: Secondary | ICD-10-CM | POA: Diagnosis not present

## 2024-03-13 DIAGNOSIS — M6281 Muscle weakness (generalized): Secondary | ICD-10-CM | POA: Diagnosis not present

## 2024-03-13 DIAGNOSIS — R293 Abnormal posture: Secondary | ICD-10-CM | POA: Diagnosis not present

## 2024-03-20 DIAGNOSIS — M25512 Pain in left shoulder: Secondary | ICD-10-CM | POA: Diagnosis not present

## 2024-03-20 DIAGNOSIS — M6281 Muscle weakness (generalized): Secondary | ICD-10-CM | POA: Diagnosis not present

## 2024-03-20 DIAGNOSIS — R293 Abnormal posture: Secondary | ICD-10-CM | POA: Diagnosis not present

## 2024-03-20 DIAGNOSIS — M25612 Stiffness of left shoulder, not elsewhere classified: Secondary | ICD-10-CM | POA: Diagnosis not present

## 2024-03-24 DIAGNOSIS — M6281 Muscle weakness (generalized): Secondary | ICD-10-CM | POA: Diagnosis not present

## 2024-03-24 DIAGNOSIS — M25612 Stiffness of left shoulder, not elsewhere classified: Secondary | ICD-10-CM | POA: Diagnosis not present

## 2024-03-24 DIAGNOSIS — M25512 Pain in left shoulder: Secondary | ICD-10-CM | POA: Diagnosis not present

## 2024-03-24 DIAGNOSIS — R293 Abnormal posture: Secondary | ICD-10-CM | POA: Diagnosis not present

## 2024-03-30 DIAGNOSIS — M6281 Muscle weakness (generalized): Secondary | ICD-10-CM | POA: Diagnosis not present

## 2024-03-30 DIAGNOSIS — M25612 Stiffness of left shoulder, not elsewhere classified: Secondary | ICD-10-CM | POA: Diagnosis not present

## 2024-03-30 DIAGNOSIS — R293 Abnormal posture: Secondary | ICD-10-CM | POA: Diagnosis not present

## 2024-03-30 DIAGNOSIS — M25512 Pain in left shoulder: Secondary | ICD-10-CM | POA: Diagnosis not present

## 2024-04-17 DIAGNOSIS — R293 Abnormal posture: Secondary | ICD-10-CM | POA: Diagnosis not present

## 2024-04-17 DIAGNOSIS — M25512 Pain in left shoulder: Secondary | ICD-10-CM | POA: Diagnosis not present

## 2024-04-17 DIAGNOSIS — M25612 Stiffness of left shoulder, not elsewhere classified: Secondary | ICD-10-CM | POA: Diagnosis not present

## 2024-04-17 DIAGNOSIS — M6281 Muscle weakness (generalized): Secondary | ICD-10-CM | POA: Diagnosis not present

## 2024-04-24 ENCOUNTER — Ambulatory Visit (INDEPENDENT_AMBULATORY_CARE_PROVIDER_SITE_OTHER): Admitting: Neurology

## 2024-04-24 ENCOUNTER — Encounter: Payer: Self-pay | Admitting: Neurology

## 2024-04-24 VITALS — BP 157/90 | HR 77 | Ht 72.0 in | Wt 175.0 lb

## 2024-04-24 DIAGNOSIS — R292 Abnormal reflex: Secondary | ICD-10-CM

## 2024-04-24 DIAGNOSIS — G629 Polyneuropathy, unspecified: Secondary | ICD-10-CM | POA: Diagnosis not present

## 2024-04-24 DIAGNOSIS — Z9889 Other specified postprocedural states: Secondary | ICD-10-CM | POA: Diagnosis not present

## 2024-04-24 DIAGNOSIS — R2681 Unsteadiness on feet: Secondary | ICD-10-CM | POA: Diagnosis not present

## 2024-04-24 MED ORDER — GABAPENTIN 300 MG PO CAPS
900.0000 mg | ORAL_CAPSULE | Freq: Every day | ORAL | 3 refills | Status: AC
Start: 2024-04-24 — End: ?

## 2024-04-24 NOTE — Patient Instructions (Addendum)
 Stop gabapentin  in the morning.  Continue gabapentin  900mg  at bedtime  MRI lumbar spine without contrast  Check feet daily   Start using a cane, especially on uneven ground  Try to cut back on alcohol

## 2024-04-24 NOTE — Progress Notes (Signed)
 Follow-up Visit   Date: 04/24/2024    Douglas Henry MRN: [id number] DOB: 1955/10/06    Douglas Henry is a 69 y.o. right-handed Caucasian male with s/p L4-5 laminectomy and decompression (09/2021) and prior tobacco use returning to the clinic for follow-up of neuropathy.  The patient was accompanied to the clinic by self.  IMPRESSION/PLAN: Peripheral neuropathy affecting the feet, idiopathic.  There is mild worsening of symptoms as expected.  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 He would like to taper gabapentin , so will see how he does with skipping the morning dose.  -  Stop gabapentin  300mg  in the morning.  Continue gabapentin  900mg  at bedtime -Patient educated on daily foot inspection, fall prevention, and safety precautions around the home. - Encouraged to reduce alcohol consumption - Start to use a cane  2.  Chronic L5 radiculopathy s/p L4-5 laminectomy and decompression in April 2023 by Dr. Lanis.  He reports having exertional leg weakness and exam shows hyperreflexia at the knees today.  Will check imaging of the lumbar spine to evaluate for adjacent disease/central canal stenosis.    - MRI lumbar spine without contrast  Return to clinic in 6 months  --------------------------------------------- History of present illness: Starting around the fall 2022, he began having cold sensation, numbness, and shooting pain in the toes.  Pain is intermittent.  Cold sensation and numbness is constant.  He wears winter socks year round to help with the cold feelings.  Resting feet on bare floor can aggravate the discomfort.  He takes gabapentin  300mg  three times daily which provides some relief and allows him to sleep. Balance is good.  No falls. He walks unassisted. He recalls having difficulty walking on sand in December 2022.  He underwent lumbar decompression at L4-5 in April and after noticing that his feet  symptoms were unchanged, requested further evaluation.    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 He drinks alcohol 2-3 times per week.  UPDATE 09/15/2022:  He is here for follow-up visit.  EMG confirmed the presence of a large fiber sensorimotor polyneuropathy affecting the feet as well as mild L5 radiculopathy bilaterally.  Neuropathy labs returned normal.  He continues to have numbness and shooting pain in the legs and feet. He takes gabapentin  300mg  in the morning and 600mg  at bedtime.  Sometimes, he continues to have severe electrical pain in the legs and he takes tylenol .  Balance is fair.  He walks unassisted, but noticed imbalance especially on uneven ground.  No falls.    UPDATE 03/23/2023:  He is here for follow-up visit.  At his last visit, gabapentin  was increased to 1200mg /d.  He has not noticed a significant change. He continues to have painful tingling in the feet and spells of severe stinging pain in the feet, which usually occurs when he is sitting in his recliner.  Pain is improved with repositioning.  His balance is getting worse and had one fall in the yard.  He did not injure himself.    He is also complaining of shortness of breath and generalized joint pain. He has not seen his PCP for this.   UPDATE 10/12/2023:  He is here for follow-up visit.  He has noticed mild progression of numbness in the legs.  He is very cautious with walking and as long as he is able to see the ground, his does well walking.  Uneven ground makes him much  more unsteady.  He was doing yard work last week and had a few falls.  He is very tearful today because his son passed away suddenly last month.  He is mourning his loss.  UPDATE 04/24/2024:  He is here for follow-up visit.  He was recently found to have prediabetes which he is managing with lifestyle changes.  He also drinks alcohol 2-3 times per week with 2-3 beverages in one setting.  More recently, he has noticed that  he is unable to walk long distances and attributes that his hip lock up.  Before he was able to walk 3 miles and now only a 100 yards.  Medications:  Current Outpatient Medications on File Prior to Visit  Medication Sig Dispense Refill   acetaminophen  (TYLENOL ) 500 MG tablet Take 1,000 mg by mouth every 6 (six) hours as needed.     gabapentin  (NEURONTIN ) 300 MG capsule Take 1 tablet in the morning 3 tablets at bedtime. (Patient taking differently: Take 1 tablet in the morning and 3 tablets at bedtime.) 360 capsule 1   ibuprofen (ADVIL,MOTRIN) 200 MG tablet Take 400 mg by mouth every 6 (six) hours as needed for mild pain or moderate pain.     lidocaine  (LMX) 4 % cream Apply 1 application. topically as needed (pain).     meloxicam (MOBIC) 15 MG tablet Take 15 mg by mouth daily as needed.     Multiple Vitamins-Minerals (MULTIVITAMIN WITH MINERALS) tablet Take 1 tablet by mouth daily.     naproxen sodium (ALEVE) 220 MG tablet Take 440 mg by mouth daily as needed (pain).     Omega-3 Fatty Acids (FISH OIL) 300 MG CAPS Take by mouth.     Polyethyl Glycol-Propyl Glycol (LUBRICATING EYE DROPS) 0.4-0.3 % SOLN Place 1 drop into both eyes as needed (dry eyes).     rosuvastatin (CRESTOR) 10 MG tablet Take 10 mg by mouth daily.     sertraline (ZOLOFT) 25 MG tablet Take 25 mg by mouth daily.     No current facility-administered medications on file prior to visit.    Allergies:  Allergies  Allergen Reactions   Darvon [Propoxyphene] Nausea Only    Darvocet   Lexapro [Escitalopram Oxalate] Nausea Only    Vital Signs:  BP (!) 157/90   Pulse 77   Ht 6' (1.829 m)   Wt 175 lb (79.4 kg)   SpO2 98%   BMI 23.73 kg/m    Neurological Exam: MENTAL STATUS including orientation to time, place, person, recent and remote memory, attention span and concentration, language, and fund of knowledge is normal.  Speech is not dysarthric.  CRANIAL NERVES:  Pupils equal round and reactive to light.  Normal conjugate,  extra-ocular eye movements in all directions of gaze.  No ptosis.  Face is symmetric.   MOTOR:  Motor strength is 5/5 in all extremities.  No atrophy, fasciculations or abnormal movements.  No pronator drift.  Tone is normal.    MSRs:                                           Right        Left brachioradialis 2+  2+  biceps 2+  2+  triceps 2+  2+  patellar 3+  3+  ankle jerk 0  0  plantar response down  down   SENSORY:  Vibration reduced at the ankles.  Temperature reduced from mid-calf distally.  Rhomberg testing is positive.   COORDINATION/GAIT:  Gait wide-based, stable, mild unsteadiness with turns.     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 There is superimposed L5 radiculopathy affecting bilateral lower extremities, mild.   Thank you for allowing me to participate in patient's care.  If I can answer any additional questions, I would be pleased to do so.    Sincerely,    Lachrisha Ziebarth K. Tobie, DO

## 2024-04-26 NOTE — Progress Notes (Signed)
 Initiated prior authorization on [facility]. System currently down and unable to submit PA. Will try again later.    The system is unable to respond at the moment. Please try refreshing the page (click on REFRESH at the top of the page or press F5) or try again at a later time.  ([id number], 04/26/2024, 2:41:00 PM)

## 2024-04-27 NOTE — Progress Notes (Signed)
 MRI Lumbar Spine Approved. The prior authorization/notification reference number is: [id number].

## 2024-07-06 ENCOUNTER — Ambulatory Visit (HOSPITAL_COMMUNITY)
Admission: RE | Admit: 2024-07-06 | Discharge: 2024-07-06 | Disposition: A | Discharge status: Home / Self Care | Source: Ambulatory Visit | Attending: Neurology | Admitting: Neurology

## 2024-07-06 DIAGNOSIS — R2681 Unsteadiness on feet: Secondary | ICD-10-CM | POA: Diagnosis present

## 2024-07-06 DIAGNOSIS — R292 Abnormal reflex: Secondary | ICD-10-CM | POA: Insufficient documentation

## 2024-07-06 DIAGNOSIS — G629 Polyneuropathy, unspecified: Secondary | ICD-10-CM | POA: Diagnosis present

## 2024-07-06 DIAGNOSIS — Z9889 Other specified postprocedural states: Secondary | ICD-10-CM | POA: Insufficient documentation

## 2024-07-11 ENCOUNTER — Ambulatory Visit: Payer: Self-pay | Admitting: Neurology

## 2024-10-23 ENCOUNTER — Ambulatory Visit: Admitting: Neurology
